# Patient Record
Sex: Female | Born: 1960
Health system: Southern US, Community
[De-identification: ages and names within clinical notes are randomized; demographics above are authoritative.]

## PROBLEM LIST (undated history)

## (undated) DIAGNOSIS — Z972 Presence of dental prosthetic device (complete) (partial): Secondary | ICD-10-CM

## (undated) DIAGNOSIS — F99 Mental disorder, not otherwise specified: Secondary | ICD-10-CM

## (undated) DIAGNOSIS — R87629 Unspecified abnormal cytological findings in specimens from vagina: Secondary | ICD-10-CM

## (undated) DIAGNOSIS — D649 Anemia, unspecified: Secondary | ICD-10-CM

## (undated) DIAGNOSIS — Z973 Presence of spectacles and contact lenses: Secondary | ICD-10-CM

## (undated) DIAGNOSIS — F329 Major depressive disorder, single episode, unspecified: Secondary | ICD-10-CM

## (undated) DIAGNOSIS — N189 Chronic kidney disease, unspecified: Secondary | ICD-10-CM

## (undated) DIAGNOSIS — F32A Depression, unspecified: Secondary | ICD-10-CM

## (undated) DIAGNOSIS — J42 Unspecified chronic bronchitis: Secondary | ICD-10-CM

## (undated) DIAGNOSIS — R0602 Shortness of breath: Secondary | ICD-10-CM

## (undated) DIAGNOSIS — F419 Anxiety disorder, unspecified: Secondary | ICD-10-CM

## (undated) DIAGNOSIS — M810 Age-related osteoporosis without current pathological fracture: Secondary | ICD-10-CM

## (undated) DIAGNOSIS — J189 Pneumonia, unspecified organism: Secondary | ICD-10-CM

## (undated) DIAGNOSIS — M1990 Unspecified osteoarthritis, unspecified site: Secondary | ICD-10-CM

## (undated) HISTORY — PX: WISDOM TOOTH EXTRACTION: SHX21

## (undated) HISTORY — PX: MOUTH SURGERY: SHX715

## (undated) HISTORY — DX: Chronic kidney disease, unspecified: N18.9

## (undated) HISTORY — DX: Unspecified abnormal cytological findings in specimens from vagina: R87.629

## (undated) HISTORY — DX: Anemia, unspecified: D64.9

## (undated) HISTORY — DX: Unspecified osteoarthritis, unspecified site: M19.90

---
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
## 1996-02-01 HISTORY — PX: KNEE ARTHROSCOPY: SUR90

## 1997-07-01 ENCOUNTER — Emergency Department (HOSPITAL_COMMUNITY): Admission: EM | Admit: 1997-07-01 | Discharge: 1997-07-01 | Payer: Self-pay | Admitting: Emergency Medicine

## 1998-01-03 ENCOUNTER — Encounter: Payer: Self-pay | Admitting: Emergency Medicine

## 1998-01-03 ENCOUNTER — Emergency Department (HOSPITAL_COMMUNITY): Admission: EM | Admit: 1998-01-03 | Discharge: 1998-01-03 | Payer: Self-pay | Admitting: Emergency Medicine

## 1998-12-11 ENCOUNTER — Other Ambulatory Visit: Admission: RE | Admit: 1998-12-11 | Discharge: 1998-12-11 | Payer: Self-pay | Admitting: Family Medicine

## 1998-12-31 ENCOUNTER — Encounter: Admission: RE | Admit: 1998-12-31 | Discharge: 1998-12-31 | Payer: Self-pay | Admitting: Family Medicine

## 1998-12-31 ENCOUNTER — Encounter: Payer: Self-pay | Admitting: Family Medicine

## 2002-01-23 ENCOUNTER — Encounter: Payer: Self-pay | Admitting: Emergency Medicine

## 2002-01-23 ENCOUNTER — Emergency Department (HOSPITAL_COMMUNITY): Admission: EM | Admit: 2002-01-23 | Discharge: 2002-01-23 | Payer: Self-pay | Admitting: Emergency Medicine

## 2002-08-13 ENCOUNTER — Encounter: Admission: RE | Admit: 2002-08-13 | Discharge: 2002-08-13 | Payer: Self-pay | Admitting: Nephrology

## 2002-08-13 ENCOUNTER — Encounter: Payer: Self-pay | Admitting: Nephrology

## 2003-05-02 ENCOUNTER — Inpatient Hospital Stay (HOSPITAL_COMMUNITY): Admission: AD | Admit: 2003-05-02 | Discharge: 2003-05-03 | Payer: Self-pay | Admitting: *Deleted

## 2004-02-20 ENCOUNTER — Inpatient Hospital Stay (HOSPITAL_COMMUNITY): Admission: EM | Admit: 2004-02-20 | Discharge: 2004-02-24 | Payer: Self-pay | Admitting: Emergency Medicine

## 2004-03-30 ENCOUNTER — Other Ambulatory Visit: Admission: RE | Admit: 2004-03-30 | Discharge: 2004-03-30 | Payer: Self-pay | Admitting: Obstetrics and Gynecology

## 2004-05-13 ENCOUNTER — Emergency Department (HOSPITAL_COMMUNITY): Admission: EM | Admit: 2004-05-13 | Discharge: 2004-05-13 | Payer: Self-pay | Admitting: Emergency Medicine

## 2005-01-04 ENCOUNTER — Ambulatory Visit: Payer: Self-pay | Admitting: Internal Medicine

## 2005-02-15 ENCOUNTER — Ambulatory Visit: Payer: Self-pay | Admitting: Internal Medicine

## 2005-02-23 ENCOUNTER — Ambulatory Visit (HOSPITAL_COMMUNITY): Admission: RE | Admit: 2005-02-23 | Discharge: 2005-02-23 | Payer: Self-pay | Admitting: Internal Medicine

## 2005-02-23 ENCOUNTER — Ambulatory Visit: Payer: Self-pay | Admitting: Internal Medicine

## 2005-03-09 ENCOUNTER — Ambulatory Visit (HOSPITAL_COMMUNITY): Admission: RE | Admit: 2005-03-09 | Discharge: 2005-03-09 | Payer: Self-pay | Admitting: Internal Medicine

## 2005-03-09 ENCOUNTER — Ambulatory Visit: Payer: Self-pay | Admitting: Internal Medicine

## 2005-03-24 ENCOUNTER — Ambulatory Visit: Payer: Self-pay | Admitting: Internal Medicine

## 2005-06-07 ENCOUNTER — Encounter (INDEPENDENT_AMBULATORY_CARE_PROVIDER_SITE_OTHER): Payer: Self-pay | Admitting: Internal Medicine

## 2005-06-07 ENCOUNTER — Ambulatory Visit (HOSPITAL_COMMUNITY): Admission: RE | Admit: 2005-06-07 | Discharge: 2005-06-07 | Payer: Self-pay | Admitting: Obstetrics and Gynecology

## 2005-06-16 ENCOUNTER — Ambulatory Visit: Payer: Self-pay | Admitting: Internal Medicine

## 2005-07-05 ENCOUNTER — Encounter: Admission: RE | Admit: 2005-07-05 | Discharge: 2005-07-28 | Payer: Self-pay | Admitting: Internal Medicine

## 2005-07-14 ENCOUNTER — Ambulatory Visit: Payer: Self-pay | Admitting: Internal Medicine

## 2005-11-29 DIAGNOSIS — F411 Generalized anxiety disorder: Secondary | ICD-10-CM

## 2005-11-29 DIAGNOSIS — M545 Low back pain, unspecified: Secondary | ICD-10-CM | POA: Insufficient documentation

## 2005-11-29 DIAGNOSIS — F17200 Nicotine dependence, unspecified, uncomplicated: Secondary | ICD-10-CM

## 2005-11-29 DIAGNOSIS — M1990 Unspecified osteoarthritis, unspecified site: Secondary | ICD-10-CM | POA: Insufficient documentation

## 2005-11-29 DIAGNOSIS — F1021 Alcohol dependence, in remission: Secondary | ICD-10-CM

## 2005-11-29 DIAGNOSIS — M25539 Pain in unspecified wrist: Secondary | ICD-10-CM

## 2005-11-29 DIAGNOSIS — Z9889 Other specified postprocedural states: Secondary | ICD-10-CM

## 2005-11-29 DIAGNOSIS — M25569 Pain in unspecified knee: Secondary | ICD-10-CM

## 2006-01-11 DIAGNOSIS — F1910 Other psychoactive substance abuse, uncomplicated: Secondary | ICD-10-CM

## 2006-01-11 DIAGNOSIS — Z8619 Personal history of other infectious and parasitic diseases: Secondary | ICD-10-CM

## 2006-01-11 DIAGNOSIS — G894 Chronic pain syndrome: Secondary | ICD-10-CM | POA: Insufficient documentation

## 2006-11-17 ENCOUNTER — Ambulatory Visit (HOSPITAL_COMMUNITY): Admission: RE | Admit: 2006-11-17 | Discharge: 2006-11-17 | Payer: Self-pay | Admitting: Obstetrics and Gynecology

## 2007-12-14 ENCOUNTER — Encounter: Admission: RE | Admit: 2007-12-14 | Discharge: 2007-12-14 | Payer: Self-pay | Admitting: Obstetrics and Gynecology

## 2008-06-13 ENCOUNTER — Encounter: Admission: RE | Admit: 2008-06-13 | Discharge: 2008-06-13 | Payer: Self-pay | Admitting: Obstetrics and Gynecology

## 2008-07-17 ENCOUNTER — Emergency Department (HOSPITAL_COMMUNITY): Admission: EM | Admit: 2008-07-17 | Discharge: 2008-07-17 | Payer: Self-pay | Admitting: Emergency Medicine

## 2009-02-06 ENCOUNTER — Encounter: Admission: RE | Admit: 2009-02-06 | Discharge: 2009-02-06 | Payer: Self-pay | Admitting: Family Medicine

## 2009-07-06 ENCOUNTER — Emergency Department (HOSPITAL_COMMUNITY): Admission: EM | Admit: 2009-07-06 | Discharge: 2009-07-07 | Payer: Self-pay | Admitting: Emergency Medicine

## 2009-11-05 ENCOUNTER — Encounter: Admission: RE | Admit: 2009-11-05 | Discharge: 2009-11-05 | Payer: Self-pay | Admitting: Obstetrics and Gynecology

## 2010-02-21 ENCOUNTER — Encounter: Payer: Self-pay | Admitting: Obstetrics and Gynecology

## 2010-06-18 NOTE — Discharge Summary (Signed)
NAMERaye [name]            ACCOUNT NO.:  [id number]   MEDICAL RECORD NO.:  [id number]          PATIENT TYPE:  INP   LOCATION:  0448                         FACILITY:  [facility]   PHYSICIAN:  Jarome Matin, M.D.DATE OF BIRTH:  1960-11-29   DATE OF ADMISSION:  02/20/2004  DATE OF DISCHARGE:  02/24/2004                                 DISCHARGE SUMMARY   HISTORY OF PRESENT ILLNESS:  Ms. Kendra [name] is a 50 year old white female who  came to the emergency room on the morning of February 20, 2004, complaining  of severe pain in her right arm.  She apparently was at a party several days  prior and had been shooting up her arms with cocaine.  She then developed  cellulitis in the arm and at the site of the cocaine injection.  The pain  continued.   PHYSICAL EXAMINATION:  VITAL SIGNS:  Temperature 99.4, pulse of 74,  respirations 20, blood pressure 111/59.  HEENT:  Negative.  CHEST:  Clear to auscultation and percussion.  HEART:  Regular sinus rhythm.  ABDOMEN:  Soft, no mass, no organomegaly.  She had active bibasilar sounds.  She also had some pain in her bladder area.  EXTREMITIES:  Right arm was swollen, painful around the upper supinator  portion of the arm, elbow; the area over the elbow and the muscle.  The area  was red, swollen, it had cellulitis over the lower arm near the elbow.  The  patient has a long history of back pain since an accident in 1999, and hit a  disk in her lumbosacral spine area.   HOSPITAL COURSE:  The patient was restarted on Clinoril 200 mg b.i.d., and  continued on Percocet 5 mg.  We changed her to Ancef 1 g IV q.8h., warm  soaks on her arm, put her on a regular diet.  Cultures of the wound grew out  some microaerophilic Streptococcus.  Blood cultures were essentially  negative.  Urinalysis was cloudy.  Showed protein, nitrites positive, with  many epithelial's and many bacteria.  Urine drug screen was positive for  marijuana.  White count was 13.8.   We continued her on the Ancef IV and gave  her some Darvocet between Percocet, two tablets q.4h.  The patient did talk  with a drug counselor, who gave her the option to come in for counseling.  Gradually, the swelling in the arm improved.  The patient was seen and  assumed to have an abscess that was draining.  We asked general surgeon to  take a look at her.  It was drained and dressed, and she was going to  continue to be managed as an outpatient and stay on the antibiotics.  Surgery came back to help to clean the wound.  The patient was discharged to  continue to clean the wound and she could go to work next week, and he would  see her in his office as an outpatient.  The patient felt better.  Wound  seemed to be healing and we decided to discharge the patient.  We discharged  her with Keflex 500 mg b.i.d.  for 10 days, Clinoril 200 mg b.i.d., and clean  the wound in the shower with saline and Betadine.  The patient to return to  work the next Monday, March 01, 2004, and she is to come to my office and  Dr. Verna Czech office, the surgeon, in about one to two weeks.  Temperature  was normal, pulse was normal, respirations were normal, blood pressure  160/73, O2 saturations were 98% on room air.  The patient was discharged  home.     CEF/MEDQ  D:  06/16/2004  T:  06/16/2004  Job:  [id number]

## 2010-06-18 NOTE — Consult Note (Signed)
NAMEFloy Sabina NO.:  [id number]   MEDICAL RECORD NO.:  [id number]          PATIENT TYPE:  INP   LOCATION:  0448                         FACILITY:  [facility]   PHYSICIAN:  Anselm Pancoast. Weatherly, M.D.DATE OF BIRTH:  1960/02/21   DATE OF CONSULTATION:  02/23/2004  DATE OF DISCHARGE:                                   CONSULTATION   REASON FOR CONSULTATION:  Antecubital abscess from intravenous drug  injections.   HISTORY:  Kendra [name] is a 50 year old female who was admitted through  the emergency room by Dr. Jeri Cos on February 20, 2004 with a  diagnosis of right arm cellulitis.  At the time I do not think she was  admitting to IV drug use.  The area has become more swollen and then drained  spontaneously over the past 24 hours.  The patient states that she has used  drugs previously and on examination it appears that this has had a previous  transverse incision in the area that has drained.  I do not appreciate any  other areas of undrained fluctuance or tenderness at this time and I think  clinically this will resolve with IV antibiotics and warm soaks.  The  culture is so far showing a gram-positive cocci but no sensitivities, and  the question whether MRSA is present is certainly under consideration.  She  has been afebrile for the last 48 hours and is presently on Rocephin.  I  would wait until we have got sensitivities and then manage her as outpatient  with warm soaks.  The patient is concerned about the incision - does it need  to be closed.  This needs to be warm soaks and left open because it will  contract very nicely with a transverse-type previous incision and avoidance  of other IV stick attempts in this area is definitely suggested.  I will  follow the next day or two but no further I&D is needed at this time.                                               ______________________________  Anselm Pancoast. Zachery Dakins, M.D.    WJW/MEDQ  D:   02/23/2004  T:  02/23/2004  Job:  [id number]

## 2010-11-26 ENCOUNTER — Other Ambulatory Visit (HOSPITAL_COMMUNITY): Payer: Self-pay | Admitting: Obstetrics and Gynecology

## 2010-11-26 DIAGNOSIS — Z1231 Encounter for screening mammogram for malignant neoplasm of breast: Secondary | ICD-10-CM

## 2010-12-14 ENCOUNTER — Ambulatory Visit
Admission: RE | Admit: 2010-12-14 | Discharge: 2010-12-14 | Disposition: A | Discharge status: Home / Self Care | Payer: PRIVATE HEALTH INSURANCE | Source: Ambulatory Visit | Attending: Obstetrics and Gynecology | Admitting: Obstetrics and Gynecology

## 2010-12-14 DIAGNOSIS — Z1231 Encounter for screening mammogram for malignant neoplasm of breast: Secondary | ICD-10-CM

## 2011-08-11 ENCOUNTER — Other Ambulatory Visit: Payer: Self-pay | Admitting: Obstetrics and Gynecology

## 2011-08-31 ENCOUNTER — Encounter (HOSPITAL_COMMUNITY): Payer: Self-pay | Admitting: Pharmacist

## 2011-09-06 ENCOUNTER — Encounter (HOSPITAL_COMMUNITY)
Admission: RE | Admit: 2011-09-06 | Discharge: 2011-09-06 | Disposition: A | Discharge status: Home / Self Care | Payer: PRIVATE HEALTH INSURANCE | Source: Ambulatory Visit | Attending: Obstetrics and Gynecology | Admitting: Obstetrics and Gynecology

## 2011-09-06 ENCOUNTER — Encounter (HOSPITAL_COMMUNITY): Payer: Self-pay

## 2011-09-06 HISTORY — DX: Unspecified chronic bronchitis: J42

## 2011-09-06 HISTORY — DX: Major depressive disorder, single episode, unspecified: F32.9

## 2011-09-06 HISTORY — DX: Anxiety disorder, unspecified: F41.9

## 2011-09-06 HISTORY — DX: Depression, unspecified: F32.A

## 2011-09-06 HISTORY — DX: Shortness of breath: R06.02

## 2011-09-06 HISTORY — DX: Mental disorder, not otherwise specified: F99

## 2011-09-06 LAB — CBC
HCT: 39.4 % (ref 36.0–46.0)
Hemoglobin: 13.1 g/dL (ref 12.0–15.0)
MCH: 30.4 pg (ref 26.0–34.0)
MCHC: 33.2 g/dL (ref 30.0–36.0)
RDW: 14.4 % (ref 11.5–15.5)

## 2011-09-06 LAB — COMPREHENSIVE METABOLIC PANEL
Creatinine, Ser: 0.55 mg/dL (ref 0.50–1.10)
GFR calc Af Amer: >90 mL/min (ref >90)
GFR calc non Af Amer: >90 mL/min (ref >90)
Sodium: 137 mEq/L (ref 135–145)
Total Bilirubin: 0.2 mg/dL — ABNORMAL LOW (ref 0.3–1.2)

## 2011-09-06 LAB — URINALYSIS, ROUTINE W REFLEX MICROSCOPIC
Ketones, ur: NEGATIVE mg/dL
Leukocytes, UA: NEGATIVE
Nitrite: NEGATIVE
Specific Gravity, Urine: <1.005 — ABNORMAL LOW (ref 1.005–1.030)
Urobilinogen, UA: 0.2 mg/dL (ref 0.0–1.0)
pH: 6 (ref 5.0–8.0)

## 2011-09-06 LAB — URINE MICROSCOPIC-ADD ON

## 2011-09-06 NOTE — Patient Instructions (Addendum)
Your procedure is scheduled on:09/13/11  Enter through the Main Entrance at :6am Pick up desk phone and dial [phone] and inform us of your arrival.  Please call [phone] if you have any problems the morning of surgery.  Remember: Do not eat after midnight:Monday Do not drink after:Midnight Monday  Take these meds the morning of surgery with a sip of water:Citalopram, Crestor  DO NOT wear jewelry, eye make-up, lipstick,body lotion, or dark fingernail polish. Do not shave for 48 hours prior to surgery.  If you are to be admitted after surgery, leave suitcase in car until your room has been assigned. Patients discharged on the day of surgery will not be allowed to drive home.   Remember to use your Hibiclens as instructed.

## 2011-09-13 ENCOUNTER — Encounter (HOSPITAL_COMMUNITY): Payer: Self-pay | Admitting: *Deleted

## 2011-09-13 ENCOUNTER — Inpatient Hospital Stay (HOSPITAL_COMMUNITY)
Admission: RE | Admit: 2011-09-13 | Discharge: 2011-09-15 | DRG: 743 | Disposition: A | Discharge status: Home / Self Care | Payer: PRIVATE HEALTH INSURANCE | Source: Ambulatory Visit | Attending: Obstetrics and Gynecology | Admitting: Obstetrics and Gynecology

## 2011-09-13 ENCOUNTER — Inpatient Hospital Stay (HOSPITAL_COMMUNITY): Payer: PRIVATE HEALTH INSURANCE | Admitting: Anesthesiology

## 2011-09-13 ENCOUNTER — Encounter (HOSPITAL_COMMUNITY)
Admission: RE | Disposition: A | Discharge status: Home / Self Care | Payer: Self-pay | Source: Ambulatory Visit | Attending: Obstetrics and Gynecology

## 2011-09-13 ENCOUNTER — Encounter (HOSPITAL_COMMUNITY): Payer: Self-pay | Admitting: Anesthesiology

## 2011-09-13 DIAGNOSIS — N946 Dysmenorrhea, unspecified: Secondary | ICD-10-CM | POA: Diagnosis present

## 2011-09-13 DIAGNOSIS — N920 Excessive and frequent menstruation with regular cycle: Secondary | ICD-10-CM

## 2011-09-13 DIAGNOSIS — D252 Subserosal leiomyoma of uterus: Secondary | ICD-10-CM | POA: Diagnosis present

## 2011-09-13 DIAGNOSIS — N949 Unspecified condition associated with female genital organs and menstrual cycle: Principal | ICD-10-CM | POA: Diagnosis present

## 2011-09-13 DIAGNOSIS — D251 Intramural leiomyoma of uterus: Secondary | ICD-10-CM | POA: Diagnosis present

## 2011-09-13 DIAGNOSIS — N831 Corpus luteum cyst of ovary, unspecified side: Secondary | ICD-10-CM | POA: Diagnosis present

## 2011-09-13 DIAGNOSIS — D219 Benign neoplasm of connective and other soft tissue, unspecified: Secondary | ICD-10-CM

## 2011-09-13 DIAGNOSIS — N938 Other specified abnormal uterine and vaginal bleeding: Principal | ICD-10-CM | POA: Diagnosis present

## 2011-09-13 DIAGNOSIS — F411 Generalized anxiety disorder: Secondary | ICD-10-CM

## 2011-09-13 DIAGNOSIS — F17200 Nicotine dependence, unspecified, uncomplicated: Secondary | ICD-10-CM

## 2011-09-13 HISTORY — PX: ABDOMINAL HYSTERECTOMY: SHX81

## 2011-09-13 LAB — CBC
Hemoglobin: 12.5 g/dL (ref 12.0–15.0)
MCH: 30.2 pg (ref 26.0–34.0)
MCV: 92.8 fL (ref 78.0–100.0)
RBC: 4.14 MIL/uL (ref 3.87–5.11)
WBC: 16.1 10*3/uL — ABNORMAL HIGH (ref 4.0–10.5)

## 2011-09-13 LAB — CREATININE, SERUM
Creatinine, Ser: 0.65 mg/dL (ref 0.50–1.10)
GFR calc Af Amer: >90 mL/min (ref >90)

## 2011-09-13 SURGERY — HYSTERECTOMY, ABDOMINAL
Anesthesia: General | Site: Abdomen | Wound class: Clean Contaminated

## 2011-09-13 MED ORDER — HYDROMORPHONE 0.3 MG/ML IV SOLN
INTRAVENOUS | Status: DC
  Administered 2011-09-13: 2.6 mg via INTRAVENOUS
  Administered 2011-09-13: 2.59 mg via INTRAVENOUS
  Administered 2011-09-13: 1.3 mg via INTRAVENOUS
  Administered 2011-09-13: 11:00:00 via INTRAVENOUS
  Filled 2011-09-13: qty 25

## 2011-09-13 MED ORDER — FENTANYL CITRATE 0.05 MG/ML IJ SOLN
INTRAMUSCULAR | Status: AC
  Administered 2011-09-13: 50 ug via INTRAVENOUS
  Filled 2011-09-13: qty 2

## 2011-09-13 MED ORDER — ONDANSETRON HCL 4 MG/2ML IJ SOLN: 4.0000 mg | Freq: Four times a day (QID) | INTRAMUSCULAR | Status: DC | PRN

## 2011-09-13 MED ORDER — LACTATED RINGERS IV SOLN
INTRAVENOUS | Status: DC
  Administered 2011-09-13 – 2011-09-14 (×3): via INTRAVENOUS

## 2011-09-13 MED ORDER — ALPRAZOLAM 0.5 MG PO TABS
1.0000 mg | ORAL_TABLET | Freq: Two times a day (BID) | ORAL | Status: DC | PRN
  Administered 2011-09-14 (×2): 1 mg via ORAL
  Filled 2011-09-13 (×2): qty 1
  Filled 2011-09-13: qty 2

## 2011-09-13 MED ORDER — ATORVASTATIN CALCIUM 20 MG PO TABS
20.0000 mg | ORAL_TABLET | Freq: Every day | ORAL | Status: DC
  Administered 2011-09-14: 20 mg via ORAL
  Filled 2011-09-13 (×2): qty 1

## 2011-09-13 MED ORDER — HEPARIN SODIUM (PORCINE) 5000 UNIT/ML IJ SOLN
INTRAMUSCULAR | Status: AC
  Administered 2011-09-13: 5000 [IU] via SUBCUTANEOUS
  Filled 2011-09-13: qty 1

## 2011-09-13 MED ORDER — OXYCODONE-ACETAMINOPHEN 5-325 MG PO TABS: 1.0000 | ORAL_TABLET | ORAL | Status: DC | PRN

## 2011-09-13 MED ORDER — CEFAZOLIN SODIUM 1-5 GM-% IV SOLN
1.0000 g | Freq: Three times a day (TID) | INTRAVENOUS | Status: AC
  Administered 2011-09-13 (×2): 1 g via INTRAVENOUS
  Filled 2011-09-13 (×2): qty 50

## 2011-09-13 MED ORDER — CITALOPRAM HYDROBROMIDE 20 MG PO TABS
20.0000 mg | ORAL_TABLET | Freq: Every day | ORAL | Status: DC
  Administered 2011-09-14: 20 mg via ORAL
  Filled 2011-09-13 (×2): qty 1

## 2011-09-13 MED ORDER — HEPARIN SODIUM (PORCINE) 5000 UNIT/ML IJ SOLN
5000.0000 [IU] | Freq: Three times a day (TID) | INTRAMUSCULAR | Status: DC
  Administered 2011-09-13 – 2011-09-15 (×6): 5000 [IU] via SUBCUTANEOUS
  Filled 2011-09-13 (×7): qty 1

## 2011-09-13 MED ORDER — MIDAZOLAM HCL 5 MG/5ML IJ SOLN
INTRAMUSCULAR | Status: DC | PRN
  Administered 2011-09-13: 2 mg via INTRAVENOUS

## 2011-09-13 MED ORDER — CEFAZOLIN SODIUM-DEXTROSE 2-3 GM-% IV SOLR
INTRAVENOUS | Status: AC
  Administered 2011-09-13: 2 g via INTRAVENOUS
  Filled 2011-09-13: qty 50

## 2011-09-13 MED ORDER — LACTATED RINGERS IV SOLN
INTRAVENOUS | Status: DC
Start: 2011-09-13 — End: 2011-09-13
  Administered 2011-09-13 (×3): via INTRAVENOUS

## 2011-09-13 MED ORDER — ALBUTEROL SULFATE HFA 108 (90 BASE) MCG/ACT IN AERS
2.0000 | INHALATION_SPRAY | Freq: Four times a day (QID) | RESPIRATORY_TRACT | Status: DC | PRN
  Filled 2011-09-13: qty 6.7

## 2011-09-13 MED ORDER — FENTANYL CITRATE 0.05 MG/ML IJ SOLN
INTRAMUSCULAR | Status: DC | PRN
  Administered 2011-09-13: 50 ug via INTRAVENOUS
  Administered 2011-09-13 (×2): 100 ug via INTRAVENOUS

## 2011-09-13 MED ORDER — FENTANYL CITRATE 0.05 MG/ML IJ SOLN
INTRAMUSCULAR | Status: AC
  Filled 2011-09-13: qty 5

## 2011-09-13 MED ORDER — PROPOFOL 10 MG/ML IV EMUL
INTRAVENOUS | Status: DC | PRN
  Administered 2011-09-13: 60 mg via INTRAVENOUS
  Administered 2011-09-13: 140 mg via INTRAVENOUS

## 2011-09-13 MED ORDER — LIDOCAINE HCL (CARDIAC) 20 MG/ML IV SOLN
INTRAVENOUS | Status: DC | PRN
  Administered 2011-09-13: 40 mg via INTRAVENOUS

## 2011-09-13 MED ORDER — ONDANSETRON HCL 4 MG PO TABS: 4.0000 mg | ORAL_TABLET | Freq: Four times a day (QID) | ORAL | Status: DC | PRN

## 2011-09-13 MED ORDER — HYDROMORPHONE HCL PF 1 MG/ML IJ SOLN
INTRAMUSCULAR | Status: AC
  Administered 2011-09-13: 0.5 mg via INTRAVENOUS
  Filled 2011-09-13: qty 1

## 2011-09-13 MED ORDER — HYDROMORPHONE HCL PF 1 MG/ML IJ SOLN
0.5000 mg | INTRAMUSCULAR | Status: DC | PRN
  Administered 2011-09-13 (×3): 0.5 mg via INTRAVENOUS

## 2011-09-13 MED ORDER — FENTANYL CITRATE 0.05 MG/ML IJ SOLN
25.0000 ug | INTRAMUSCULAR | Status: DC | PRN
  Administered 2011-09-13 (×2): 50 ug via INTRAVENOUS

## 2011-09-13 MED ORDER — LIDOCAINE HCL (CARDIAC) 20 MG/ML IV SOLN
INTRAVENOUS | Status: AC
  Filled 2011-09-13: qty 5

## 2011-09-13 MED ORDER — MIDAZOLAM HCL 2 MG/2ML IJ SOLN
INTRAMUSCULAR | Status: AC
  Filled 2011-09-13: qty 2

## 2011-09-13 MED ORDER — NALOXONE HCL 0.4 MG/ML IJ SOLN: 0.4000 mg | INTRAMUSCULAR | Status: DC | PRN

## 2011-09-13 MED ORDER — CEFAZOLIN SODIUM-DEXTROSE 2-3 GM-% IV SOLR: 2.0000 g | INTRAVENOUS | Status: DC

## 2011-09-13 MED ORDER — NEOSTIGMINE METHYLSULFATE 1 MG/ML IJ SOLN
INTRAMUSCULAR | Status: DC | PRN
  Administered 2011-09-13: 4 mg via INTRAVENOUS

## 2011-09-13 MED ORDER — ALBUTEROL SULFATE (5 MG/ML) 0.5% IN NEBU
2.5000 mg | INHALATION_SOLUTION | Freq: Once | RESPIRATORY_TRACT | Status: AC
  Administered 2011-09-13: 2.5 mg via RESPIRATORY_TRACT

## 2011-09-13 MED ORDER — EPHEDRINE SULFATE 50 MG/ML IJ SOLN
INTRAMUSCULAR | Status: DC | PRN
  Administered 2011-09-13: 5 mg via INTRAVENOUS

## 2011-09-13 MED ORDER — DEXAMETHASONE SODIUM PHOSPHATE 10 MG/ML IJ SOLN
INTRAMUSCULAR | Status: AC
  Filled 2011-09-13: qty 1

## 2011-09-13 MED ORDER — ROCURONIUM BROMIDE 100 MG/10ML IV SOLN
INTRAVENOUS | Status: DC | PRN
  Administered 2011-09-13: 10 mg via INTRAVENOUS
  Administered 2011-09-13: 40 mg via INTRAVENOUS

## 2011-09-13 MED ORDER — 0.9 % SODIUM CHLORIDE (POUR BTL) OPTIME
TOPICAL | Status: DC | PRN
  Administered 2011-09-13: 500 mL

## 2011-09-13 MED ORDER — DIPHENHYDRAMINE HCL 12.5 MG/5ML PO ELIX: 12.5000 mg | ORAL_SOLUTION | Freq: Four times a day (QID) | ORAL | Status: DC | PRN

## 2011-09-13 MED ORDER — KETOROLAC TROMETHAMINE 30 MG/ML IJ SOLN: 15.0000 mg | Freq: Once | INTRAMUSCULAR | Status: DC | PRN

## 2011-09-13 MED ORDER — HYDROMORPHONE HCL PF 1 MG/ML IJ SOLN
INTRAMUSCULAR | Status: AC
  Filled 2011-09-13: qty 1

## 2011-09-13 MED ORDER — HYDROMORPHONE HCL PF 1 MG/ML IJ SOLN
INTRAMUSCULAR | Status: DC | PRN
  Administered 2011-09-13: 1 mg via INTRAVENOUS

## 2011-09-13 MED ORDER — ONDANSETRON HCL 4 MG/2ML IJ SOLN
4.0000 mg | Freq: Four times a day (QID) | INTRAMUSCULAR | Status: DC | PRN
  Administered 2011-09-14: 4 mg via INTRAVENOUS
  Filled 2011-09-13: qty 2

## 2011-09-13 MED ORDER — GLYCOPYRROLATE 0.2 MG/ML IJ SOLN
INTRAMUSCULAR | Status: AC
  Filled 2011-09-13: qty 2

## 2011-09-13 MED ORDER — MIDAZOLAM HCL 2 MG/2ML IJ SOLN: 0.5000 mg | Freq: Once | INTRAMUSCULAR | Status: DC | PRN

## 2011-09-13 MED ORDER — ONDANSETRON HCL 4 MG/2ML IJ SOLN
INTRAMUSCULAR | Status: AC
  Filled 2011-09-13: qty 2

## 2011-09-13 MED ORDER — NEOSTIGMINE METHYLSULFATE 1 MG/ML IJ SOLN
INTRAMUSCULAR | Status: AC
  Filled 2011-09-13: qty 10

## 2011-09-13 MED ORDER — OXYCODONE-ACETAMINOPHEN 5-325 MG PO TABS
1.0000 | ORAL_TABLET | ORAL | Status: DC | PRN
  Administered 2011-09-13 – 2011-09-14 (×3): 2 via ORAL
  Filled 2011-09-13 (×3): qty 2

## 2011-09-13 MED ORDER — HEPARIN SODIUM (PORCINE) 5000 UNIT/ML IJ SOLN
5000.0000 [IU] | INTRAMUSCULAR | Status: AC
  Administered 2011-09-13: 5000 [IU] via SUBCUTANEOUS

## 2011-09-13 MED ORDER — DIPHENHYDRAMINE HCL 50 MG/ML IJ SOLN: 12.5000 mg | Freq: Four times a day (QID) | INTRAMUSCULAR | Status: DC | PRN

## 2011-09-13 MED ORDER — EPHEDRINE 5 MG/ML INJ
INTRAVENOUS | Status: AC
  Filled 2011-09-13: qty 10

## 2011-09-13 MED ORDER — PROPOFOL 10 MG/ML IV EMUL
INTRAVENOUS | Status: AC
  Filled 2011-09-13: qty 20

## 2011-09-13 MED ORDER — GLYCOPYRROLATE 0.2 MG/ML IJ SOLN
INTRAMUSCULAR | Status: AC
  Filled 2011-09-13: qty 1

## 2011-09-13 MED ORDER — ALBUTEROL SULFATE (5 MG/ML) 0.5% IN NEBU
INHALATION_SOLUTION | RESPIRATORY_TRACT | Status: AC
  Administered 2011-09-13: 2.5 mg via RESPIRATORY_TRACT
  Filled 2011-09-13: qty 0.5

## 2011-09-13 MED ORDER — ROCURONIUM BROMIDE 50 MG/5ML IV SOLN
INTRAVENOUS | Status: AC
  Filled 2011-09-13: qty 1

## 2011-09-13 MED ORDER — SODIUM CHLORIDE 0.9 % IJ SOLN: 9.0000 mL | INTRAMUSCULAR | Status: DC | PRN

## 2011-09-13 MED ORDER — TEMAZEPAM 15 MG PO CAPS
30.0000 mg | ORAL_CAPSULE | Freq: Every evening | ORAL | Status: DC | PRN
  Administered 2011-09-14: 30 mg via ORAL
  Filled 2011-09-13: qty 2

## 2011-09-13 MED ORDER — MEPERIDINE HCL 25 MG/ML IJ SOLN: 6.2500 mg | INTRAMUSCULAR | Status: DC | PRN

## 2011-09-13 MED ORDER — GLYCOPYRROLATE 0.2 MG/ML IJ SOLN
INTRAMUSCULAR | Status: DC | PRN
  Administered 2011-09-13: 0.2 mg via INTRAVENOUS
  Administered 2011-09-13: .8 mg via INTRAVENOUS

## 2011-09-13 MED ORDER — PROMETHAZINE HCL 25 MG/ML IJ SOLN: 6.2500 mg | INTRAMUSCULAR | Status: DC | PRN

## 2011-09-13 SURGICAL SUPPLY — 31 items
CANISTER SUCTION 2500CC (MISCELLANEOUS) ×2 IMPLANT
CLOTH BEACON ORANGE TIMEOUT ST (SAFETY) ×2 IMPLANT
CONT PATH 16OZ SNAP LID 3702 (MISCELLANEOUS) ×2 IMPLANT
DECANTER SPIKE VIAL GLASS SM (MISCELLANEOUS) IMPLANT
DRSG VASELINE 3X18 (GAUZE/BANDAGES/DRESSINGS) ×1 IMPLANT
GAUZE SPONGE 4X4 12PLY STRL LF (GAUZE/BANDAGES/DRESSINGS) ×2 IMPLANT
GLOVE BIO SURGEON STRL SZ7.5 (GLOVE) ×4 IMPLANT
GOWN PREVENTION PLUS LG XLONG (DISPOSABLE) ×4 IMPLANT
GOWN PREVENTION PLUS XLARGE (GOWN DISPOSABLE) ×2 IMPLANT
NS IRRIG 1000ML POUR BTL (IV SOLUTION) ×3 IMPLANT
PACK ABDOMINAL GYN (CUSTOM PROCEDURE TRAY) ×2 IMPLANT
PAD OB MATERNITY 4.3X12.25 (PERSONAL CARE ITEMS) ×2 IMPLANT
PROTECTOR NERVE ULNAR (MISCELLANEOUS) ×2 IMPLANT
RETRACTOR WND ALEXIS 25 LRG (MISCELLANEOUS) IMPLANT
RTRCTR WOUND ALEXIS 25CM LRG (MISCELLANEOUS) ×2
SPONGE GAUZE 4X4 12PLY (GAUZE/BANDAGES/DRESSINGS) ×1 IMPLANT
SPONGE LAP 18X18 X RAY DECT (DISPOSABLE) ×4 IMPLANT
SPONGE LAP 4X18 X RAY DECT (DISPOSABLE) ×2 IMPLANT
STAPLER VISISTAT 35W (STAPLE) ×2 IMPLANT
SUT PDS AB 0 CTX 60 (SUTURE) IMPLANT
SUT VIC AB 0 CT1 18XCR BRD8 (SUTURE) ×3 IMPLANT
SUT VIC AB 0 CT1 27 (SUTURE) ×10
SUT VIC AB 0 CT1 27XBRD ANBCTR (SUTURE) ×3 IMPLANT
SUT VIC AB 0 CT1 8-18 (SUTURE) ×4
SUT VIC AB 3-0 CTX 36 (SUTURE) ×2 IMPLANT
SUT VIC AB 3-0 SH 27 (SUTURE)
SUT VIC AB 3-0 SH 27X BRD (SUTURE) IMPLANT
SUT VICRYL 0 TIES 12 18 (SUTURE) ×2 IMPLANT
TOWEL OR 17X24 6PK STRL BLUE (TOWEL DISPOSABLE) ×4 IMPLANT
TRAY FOLEY CATH 14FR (SET/KITS/TRAYS/PACK) ×2 IMPLANT
WATER STERILE IRR 1000ML POUR (IV SOLUTION) ×1 IMPLANT

## 2011-09-13 NOTE — Progress Notes (Signed)
Post-op check S/p TAH/BSO Comfortable, no c/o Afeb, VSS, clear urine Abd- soft, dressing C/D/I Continue current care

## 2011-09-13 NOTE — Addendum Note (Signed)
Addendum  created 09/13/11 1307 by Graciela Husbands, CRNA   Modules edited:Charges VN

## 2011-09-13 NOTE — H&P (Signed)
Kendra [name], [name] NO.:  [id number]  MEDICAL RECORD NO.:  [id number]  LOCATION:                                 FACILITY:  PHYSICIAN:  Malachi Pro. Ambrose Mantle, M.D. DATE OF BIRTH:  26-May-1960  DATE OF ADMISSION: DATE OF DISCHARGE:                             HISTORY & PHYSICAL   HISTORY OF PRESENT ILLNESS:  This is a 51 year old white female, para 2- 0-3-2, who is admitted to the hospital for abdominal hysterectomy and bilateral salpingo-oophorectomy because of large uterine fibroid, dysmenorrhea, and heavy uterine bleeding.  The patient's last menstrual period was August 29, 2011 lasting until the present, previous period was on July 02, 2011.  Her periods before this last period had never lasted over 8 days.  They are characterized by extremely heavy flow and passage of large clots.  The patient has been known to have fibroids since at least 2008.  In 2011, she requested to have her fibroids removed.  At that time, she claimed use 8-10 tampons a day pass large clots, where she would wear a pad with a tampon at night.  First 2 days of her periods, her pain was 5-6/10.  Uterus had an 8-cm right cornual fibroid. In 2012, she again requested to have surgery for the hysterectomy.  At that time, the fibroid seemed to be 8-10 cm on the right side of her uterus.  At this time, she is admitted for this surgery.  ALLERGIES:  Reveals no latex allergy.  The patient does claimed to be allergic to sulfa and doxycycline.  PAST MEDICAL HISTORY:  She does have a history of generalized anxiety and depression.  She has a history of high cholesterol.  She had spontaneous abortions in '81, '92, and 2005, and she has a history of superficial thrombophlebitis.  SURGICAL HISTORY:  In 1980, wisdom teeth were extracted.  Knee surgery was in 2001.  MEDICATIONS:  She does take citalopram, Crestor, temazepam.  Although she states recently she has not taken the temazepam or  alprazolam.  SOCIAL HISTORY:  The patient does smoke one-half to a pack of cigarettes a day.  She denies illicit substance abuse and denies alcohol use.  OBSTETRIC HISTORY:  She had vaginal deliveries in '86 and '96, had spontaneous abortions in '81, '92, and 2005.  PHYSICAL EXAMINATION:  GENERAL:  The patient is a well-developed, slender white female, in no distress. VITAL SIGNS:  Her blood pressure is 110/70, pulse is 80. HEAD, EYES, NOSE, AND THROAT:  Reveal evidence of nicotine stains on her teeth. NECK:  Supple without thyromegaly. HEART:  Normal size and sounds.  No murmurs. LUNGS:  Clear to auscultation. BREASTS:  Soft without masses. ABDOMEN:  Soft and nontender.  No masses are palpable. PELVIC:  Reveals normal external genitalia.  There is menstrual blood in the vagina.  The cervix is clean.  Uterus is anterior, thought to be about 12-14 week size.  Adnexa are free of masses.  ADMITTING IMPRESSION: 1. Leiomyomata uteri. 2. Dysmenorrhea. 3. Menorrhagia.  PLAN:  The patient is admitted for abdominal hysterectomy.  She has chosen to have her ovaries and tubes removed.  She has been informed of the risks of surgery  including, but not limited to pulmonary embolus, wound disruption, hemorrhage, need for reoperation and/or transfusion, fistula formation, nerve injury, intestinal obstruction.  She also has been advised that no guarantee can be made as to what impact it will have on her sex drive.  The patient is prepared for surgery and agrees to proceed.     Malachi Pro. Ambrose Mantle, M.D.     TFH/MEDQ  D:  09/12/2011  T:  09/12/2011  Job:  [id number]

## 2011-09-13 NOTE — Anesthesia Postprocedure Evaluation (Signed)
  Anesthesia Post-op Note  Patient: Kendra [name]  Procedure(s) Performed: Procedure(s) (LRB): HYSTERECTOMY ABDOMINAL (N/A)  Patient Location: PACU  Anesthesia Type: General  Level of Consciousness: awake  Airway and Oxygen Therapy: Patient Spontanous Breathing  Post-op Pain: mild  Post-op Assessment: Patient's Cardiovascular Status Stable and Respiratory Function Stable  Post-op Vital Signs: stable  Complications: No apparent anesthesia complications

## 2011-09-13 NOTE — Addendum Note (Signed)
Addendum  created 09/13/11 1308 by Graciela Husbands, CRNA   Modules edited:Charges VN

## 2011-09-13 NOTE — Transfer of Care (Signed)
Immediate Anesthesia Transfer of Care Note  Patient: Kendra [name]  Procedure(s) Performed: Procedure(s) (LRB): HYSTERECTOMY ABDOMINAL (N/A)  Patient Location: PACU  Anesthesia Type: General  Level of Consciousness: awake, alert  and oriented  Airway & Oxygen Therapy: Patient Spontanous Breathing and Patient connected to nasal cannula oxygen  Post-op Assessment: Report given to PACU RN and Post -op Vital signs reviewed and stable  Post vital signs: Reviewed and stable  Complications: No apparent anesthesia complications

## 2011-09-13 NOTE — Anesthesia Procedure Notes (Signed)
Procedure Name: Intubation Date/Time: 09/13/2011 7:29 AM Performed by: Graciela Husbands Pre-anesthesia Checklist: Patient identified, Emergency Drugs available, Suction available, Patient being monitored and Timeout performed Patient Re-evaluated:Patient Re-evaluated prior to inductionOxygen Delivery Method: Circle system utilized Preoxygenation: Pre-oxygenation with 100% oxygen Intubation Type: IV induction Ventilation: Mask ventilation without difficulty Laryngoscope Size: Miller and 2 Grade View: Grade I Tube size: 7.0 mm Number of attempts: 1 Placement Confirmation: ETT inserted through vocal cords under direct vision,  positive ETCO2,  CO2 detector and breath sounds checked- equal and bilateral Secured at: 22 cm Tube secured with: Tape Dental Injury: Teeth and Oropharynx as per pre-operative assessment

## 2011-09-13 NOTE — H&P (Signed)
  H&P  50 YO wf PARA 2032 ADMITTED FOR tah-bso BECAUSE OF FIBROIDS, DYSMENORRHEA AND MENORRHAGIA.   LMP 08-29-11 to the present  PMP 07-02-11     This pt has had heavy and painful periods for years and has had a large fibroid for several years She has requested a hysterectomy for years and now has decided to follow through with that desire.   PMH: Allegies: sulfa and doxycycline   ILLNESSES: Anxiety and depression  Operations: Wisdom teeth extracted and knee surgery.  Alcohol and drugs: None Tobacco: possibly 1 pack per day Family Hx: Mother : HBP and other illnesses. Brother 54 good health   PE: BP 109/71 P 65 R 20 T 98.1  HENT: No abnormalities other than stained teeth. The neck is supple without thyromegaly. The breasts are soft without masses The lungs are clear to A The heart has normal sounds and is normal size The abdomen is soft and not tender There are no masses. The external genitalia are normal. There is menstrual blood in the vagina and the cervix is clean. The uterus is anterior and 12 weeks size and enlarged to the right. The adnexa are clear.  Impression: Leiomyomata uteri, menorrhagia, dysmenorrhea   Disp: TAH and BSO The pt has elected to have her ovaries removed. She has been informed of the risks of surgery and id ready to proceed.

## 2011-09-13 NOTE — Anesthesia Postprocedure Evaluation (Signed)
  Anesthesia Post-op Note  Patient: Kendra [name]  Procedure(s) Performed: Procedure(s) (LRB): HYSTERECTOMY ABDOMINAL (N/A)  Patient Location: PACU  Anesthesia Type: General  Level of Consciousness: awake, alert  and oriented  Airway and Oxygen Therapy: Patient Spontanous Breathing  Post-op Pain: mild, moderate  Post-op Assessment: Post-op Vital signs reviewed, Patient's Cardiovascular Status Stable, Respiratory Function Stable, Patent Airway, No signs of Nausea or vomiting and Pain level controlled  Post-op Vital Signs: Reviewed and stable  Complications: No apparent anesthesia complications

## 2011-09-13 NOTE — Addendum Note (Signed)
Addendum  created 09/13/11 1700 by Renford Dills, CRNA   Modules edited:Notes Section

## 2011-09-13 NOTE — Op Note (Signed)
Operative note on Kendra [name]:  Date of the operation: 09/13/2011  Preoperative diagnosis: Leiomyomata uteri, menorrhagia, dysmenorrhea  Postoperative diagnosis: Same  Anesthesia: Gen.  Operation: Total abdominal hysterectomy bilateral salpingo-oophorectomy  Operator: Ambrose Mantle assistant: Senaida Ores  The patient was brought to the operating room and placed under satisfactory general anesthesia by the nurse anesthetist with Dr. Malen Gauze assisting. The patient was placed in a frog-leg position and the abdomen vagina and urethra were prepped with Betadine solution. The bladder was catheterized with a Foley catheter in to straight drainage. Exam revealed the uterus to be 12-14 weeks size and deviated somewhat to the right. There were no adnexal masses. A timeout was done. The patient was placed supine and the abdomen was draped as a sterile field. A transverse incision was made suprapubically and carried in layers through the skin, subcutaneous tissue, and fascia. The fascia was separated from the rectus muscles superiorly and inferiorly and the peritoneum was opened vertically. The upper abdomen was explored. The liver was smooth, the gallbladder was smooth and cystic without stones, and both kidneys felt normal.An Alexis retractor was placed into the abdominal cavity and 4  packs were used to hold the bowel out of the surgical field. There was a large cyst on the left ovary thought to represent a corpus luteum cyst,the right ovary and fallopian tubes were normal, both cul-de-sacs were normal and the uterus was markedly enlarged to about 14 weeks' size. A towel clip was placed on the fibroid and the uterus was elevated into the operative field. The upper pedicles were grasped with Kelly clamps and the round ligaments bilaterally were divided with the Bovie and a bladder flap was developed. The uterine vessels were skeletonized the infundibulopelvic ligaments bilaterally were divided between clamps and  doubly suture ligated, the uterine vessels were clamped cut and suture ligated, the paracervical and parametrial tissues were clamped cut and suture-ligated and the uterosacral ligaments were suture-ligated and held. The vagina was entered from both sides, vaginal angle sutures were placed and then the uterus tubes and ovaries were removed as a single specimen by transecting the upper vagina. The central portion of the vagina was closed with interrupted figure-of-eight sutures of 0 Vicryl. The uterus was opened and I saw no abnormalities of the endometrial cavity other than the large fibroid was deviating the cavity markedly to the left. Both ureters were inspected and found to be free of the operative field and normal in size and contour. The uterosacral ligaments were sutured together in the midline and the vaginal cuff was reperitonealized with interrupted figure-of-eight sutures of 0 Vicryl. Liberal irrigation confirmed hemostasis, the packs and retractor were removed, and the abdominal wall was closed in layers using interrupted sutures of 0 Vicryl to close the rectus muscle and peritoneum in one layer,2 running sutures of 0 Vicryl on the fascia, a running 3-0 Vicryl on the subcutaneous tissue and staples on the skin. The patient seemed to tolerate the procedure well and was returned to recovery in satisfactory condition. Sponge and needle counts were correct and I estimated the blood loss at no more than 200 cc

## 2011-09-13 NOTE — Progress Notes (Signed)
Patient ID: Kendra [name], female   DOB: [birth year], 51 y.o.   MRN: [id number] The pt reports no change in her health since her exam 09-12-11.

## 2011-09-13 NOTE — Anesthesia Preprocedure Evaluation (Addendum)
Anesthesia Evaluation  Patient identified by MRN, date of birth, ID band Patient awake    Reviewed: Allergy & Precautions, H&P , Patient's Chart, lab work & pertinent test results, reviewed documented beta blocker date and time   History of Anesthesia Complications Negative for: history of anesthetic complications  Airway Mallampati: II TM Distance: >3 FB Neck ROM: full    Dental No notable dental hx.    Pulmonary neg pulmonary ROS, shortness of breath, COPD COPD inhaler, Current Smoker,  breath sounds clear to auscultation  Pulmonary exam normal       Cardiovascular Exercise Tolerance: Good negative cardio ROS  Rhythm:regular Rate:Normal     Neuro/Psych PSYCHIATRIC DISORDERS negative neurological ROS  negative psych ROS   GI/Hepatic negative GI ROS, Neg liver ROS,   Endo/Other  negative endocrine ROS  Renal/GU negative Renal ROS     Musculoskeletal   Abdominal   Peds  Hematology negative hematology ROS (+)   Anesthesia Other Findings Anxiety     Mental disorder        Depression     Bronchitis, chronic   inhaler prescribed    Shortness of breath   on exertion, smoker  Past hep C past polysubstance abuse   Reproductive/Obstetrics negative OB ROS                          Anesthesia Physical Anesthesia Plan  ASA: III  Anesthesia Plan: General ETT   Post-op Pain Management:    Induction:   Airway Management Planned:   Additional Equipment:   Intra-op Plan:   Post-operative Plan:   Informed Consent: I have reviewed the patients History and Physical, chart, labs and discussed the procedure including the risks, benefits and alternatives for the proposed anesthesia with the patient or authorized representative who has indicated his/her understanding and acceptance.   Dental Advisory Given  Plan Discussed with: CRNA and Surgeon  Anesthesia Plan Comments:          Anesthesia Quick Evaluation

## 2011-09-14 ENCOUNTER — Encounter (HOSPITAL_COMMUNITY): Payer: Self-pay | Admitting: Obstetrics and Gynecology

## 2011-09-14 LAB — CBC
MCV: 93.7 fL (ref 78.0–100.0)
Platelets: 269 10*3/uL (ref 150–400)
RBC: 3.65 MIL/uL — ABNORMAL LOW (ref 3.87–5.11)
RDW: 14.3 % (ref 11.5–15.5)
WBC: 13.4 10*3/uL — ABNORMAL HIGH (ref 4.0–10.5)

## 2011-09-14 MED ORDER — OXYCODONE-ACETAMINOPHEN 5-325 MG PO TABS
1.0000 | ORAL_TABLET | ORAL | Status: DC | PRN
  Administered 2011-09-14 – 2011-09-15 (×7): 1 via ORAL
  Filled 2011-09-14 (×7): qty 1

## 2011-09-14 MED ORDER — NICOTINE 21 MG/24HR TD PT24
21.0000 mg | MEDICATED_PATCH | Freq: Every day | TRANSDERMAL | Status: DC
  Filled 2011-09-14 (×2): qty 1

## 2011-09-14 NOTE — Progress Notes (Signed)
Ur chart review completed.  

## 2011-09-14 NOTE — Progress Notes (Signed)
Patient ID: Kendra [name], female   DOB: Jul 25, 1960, 51 y.o.   MRN: [id number] #1 afebrile BP normal Pulse 56-60 O2 sat on O2 is 97% HGB is stable Abdomen is somewhat tense from voluntary guarding She c/o generalized pain She has been able to tolerate some liquids She does not feel ready for d/c.

## 2011-09-15 MED ORDER — OXYCODONE-ACETAMINOPHEN 5-325 MG PO TABS: 1.0000 | ORAL_TABLET | Freq: Four times a day (QID) | ORAL | Status: AC | PRN

## 2011-09-15 MED ORDER — IBUPROFEN 600 MG PO TABS: 600.0000 mg | ORAL_TABLET | Freq: Four times a day (QID) | ORAL | Status: AC | PRN

## 2011-09-15 NOTE — Progress Notes (Signed)
Patient ID: Kendra [name], female   DOB: [birth year], 51 y.o.   MRN: [id number] #2 afebrile HAS LESS PAIN tolerating a diet. Is ready for d.c. The abdomen is soft and flat and the dressing is dry.

## 2011-09-15 NOTE — Progress Notes (Signed)
Pt is discharged in the care of Husband.Downstairs per ambulatory. Spirits were good. States pain is improving. Abdominal incision is clean and dry. Scanty amt of vaginal bleeding noted on V-pad.Understood all discharge instructions well.Questions asked and answered.

## 2011-09-15 NOTE — Discharge Summary (Signed)
NAMEKYNADIE, YAUN NO.:  [id number]  MEDICAL RECORD NO.:  [id number]  LOCATION:  9302                          FACILITY:  [facility]  PHYSICIAN:  Malachi Pro. Ambrose Mantle, M.D. DATE OF BIRTH:  1960/05/07  DATE OF ADMISSION:  09/13/2011 DATE OF DISCHARGE:  09/15/2011                              DISCHARGE SUMMARY   HISTORY:  This 51 year old white female, para 2-0-3-2, admitted for hysterectomy and removal of both tubes and ovaries because of a large uterine fibroid, dysmenorrhea, and heavy uterine bleeding.  The patient underwent abdominal hysterectomy and bilateral salpingo-oophorectomy under general anesthesia by Dr. Ambrose Mantle with Dr. Senaida Ores assisting. No complications were encountered during the surgery.  Postoperatively, the patient dealt with pain, but her vital signs and blood count were normal.  Her abdomen is soft, and gradually the pain lessened.  On the 2nd postop day, she was afebrile, tolerating a diet, ambulating well without difficulty, and was ready for discharge.  LABORATORY DATA:  Showed initial hemoglobin of 12.5, hematocrit 38.4, white count 16100, platelet count 286,000.  Pregnancy test was negative. Followup hemoglobin of 11.1, hematocrit 34.2, white count 13400. Pathology report is pending.  FINAL DIAGNOSES:  Leiomyomata uteri, menorrhagia, dysmenorrhea, generalized anxiety disorder, history of drug abuse, chronic pain.  OPERATION:  Abdominal hysterectomy, bilateral salpingo-oophorectomy.  FINAL CONDITION:  Improved.  INSTRUCTIONS:  Our regular discharge instructions.  No vaginal entrance. No heavy lifting or strenuous activity.  Call with any fever above 100.4 degrees.  Call with any unusual problems.  Return to the office in 4 days to have her staples removed.  MEDICATIONS:  Continue her medications that she came to the hospital with including albuterol as needed, alprazolam as needed, Celexa 20 mg a day, Crestor 20 mg a day, Restoril 30 mg  at night as needed for sleep. She is also to take Motrin 600 mg 1 tablets every 6 hours as needed for pain and Percocet 5/325, 30 tablets, 1 every 6 hours as needed for pain.     Malachi Pro. Ambrose Mantle, M.D.     TFH/MEDQ  D:  09/15/2011  T:  09/15/2011  Job:  [id number]

## 2011-10-25 ENCOUNTER — Other Ambulatory Visit: Payer: Self-pay | Admitting: Obstetrics and Gynecology

## 2011-10-26 ENCOUNTER — Other Ambulatory Visit: Payer: Self-pay | Admitting: Obstetrics and Gynecology

## 2011-10-26 DIAGNOSIS — Z1231 Encounter for screening mammogram for malignant neoplasm of breast: Secondary | ICD-10-CM

## 2011-10-26 DIAGNOSIS — Z780 Asymptomatic menopausal state: Secondary | ICD-10-CM

## 2011-12-19 ENCOUNTER — Ambulatory Visit
Admission: RE | Admit: 2011-12-19 | Discharge: 2011-12-19 | Disposition: A | Discharge status: Home / Self Care | Payer: PRIVATE HEALTH INSURANCE | Source: Ambulatory Visit | Attending: Obstetrics and Gynecology | Admitting: Obstetrics and Gynecology

## 2011-12-19 DIAGNOSIS — Z780 Asymptomatic menopausal state: Secondary | ICD-10-CM

## 2011-12-19 DIAGNOSIS — Z1231 Encounter for screening mammogram for malignant neoplasm of breast: Secondary | ICD-10-CM

## 2011-12-21 ENCOUNTER — Other Ambulatory Visit: Payer: Self-pay | Admitting: Obstetrics and Gynecology

## 2011-12-21 DIAGNOSIS — R928 Other abnormal and inconclusive findings on diagnostic imaging of breast: Secondary | ICD-10-CM

## 2012-01-02 ENCOUNTER — Ambulatory Visit
Admission: RE | Admit: 2012-01-02 | Discharge: 2012-01-02 | Disposition: A | Discharge status: Home / Self Care | Payer: PRIVATE HEALTH INSURANCE | Source: Ambulatory Visit | Attending: Obstetrics and Gynecology | Admitting: Obstetrics and Gynecology

## 2012-01-02 DIAGNOSIS — R928 Other abnormal and inconclusive findings on diagnostic imaging of breast: Secondary | ICD-10-CM

## 2012-01-18 ENCOUNTER — Ambulatory Visit: Payer: PRIVATE HEALTH INSURANCE

## 2012-01-18 ENCOUNTER — Other Ambulatory Visit: Payer: PRIVATE HEALTH INSURANCE

## 2013-04-26 ENCOUNTER — Other Ambulatory Visit: Payer: Self-pay

## 2013-04-26 DIAGNOSIS — Z1231 Encounter for screening mammogram for malignant neoplasm of breast: Secondary | ICD-10-CM

## 2013-05-01 ENCOUNTER — Ambulatory Visit
Admission: RE | Admit: 2013-05-01 | Discharge: 2013-05-01 | Disposition: A | Discharge status: Home / Self Care | Payer: PRIVATE HEALTH INSURANCE | Source: Ambulatory Visit

## 2013-05-01 DIAGNOSIS — Z1231 Encounter for screening mammogram for malignant neoplasm of breast: Secondary | ICD-10-CM

## 2013-12-05 ENCOUNTER — Other Ambulatory Visit: Payer: Self-pay | Admitting: Orthopedic Surgery

## 2013-12-31 ENCOUNTER — Encounter (HOSPITAL_BASED_OUTPATIENT_CLINIC_OR_DEPARTMENT_OTHER): Payer: Self-pay | Admitting: *Deleted

## 2013-12-31 NOTE — Progress Notes (Signed)
Pt has recent bronchitis-got a Z-pac-and cough meds-she still smokes No labs needed

## 2014-01-02 NOTE — H&P (Signed)
Kendra [name] is an 53 y.o. female.   CC / Reason for Visit: Right upper extremity problem HPI: This patient returns for reevaluation.  She reports that she is in the midst of a mild upper respiratory infection but thinks that it will be sufficiently resolved by next week for surgery.  She reaffirms that she would like to proceed with right ulnar neuroplasty at the elbow, as well as addressing her right thumb MP joint.  HPI 11-20-13:  This patient returns for reevaluation, fairly frustrated.  She continues to take gabapentin 3 times a day and intermittent tramadol and notes that the right elbow injection may have helped very slightly.  She reports pain in the right elbow, particularly posterior laterally in the gutter, and then posterior medially that she indicates along the course of the ulnar nerve.  Occasionally she reports she has numbness and tingling in the ring and small fingers as well.  Lastly, the right thumb is increasingly symptomatic at the MP joint.  HPI from 10-23-13:  This patient returns having undergone an interval NCS/EMG with Dr. Mina [name], indicating a relatively normal study without right ulnar mononeuropathy or evidence of radiculopathy.  She reports that she did take a 12 day steroid taper, and continues to take Neurontin and ibuprofen, and is plagued by pain about the region of the elbow as well as the right thumb.  She reports that the ulnar-sided pain at the elbow runs along the course of the ulnar nerve, but is also distal to the medial epicondyle and anterior to the course of the nerve.  On the lateral side, the pain is not characteristic for tennis elbow, but seems to lie in the soft spot posteriorly to the edge of the radiocapitellar joint.  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  Her symptoms date back to at least April, when she was first evaluated by Dr. Rip [name].  Over  the course of time, she has had oral steroids, and intra-articular elbow injection, and both medial and lateral epicondylar injections.  Records would indicate that the oral steroids were not very helpful, but with questioning today it appears as if they were quite helpful for at least several days before major recurrence of symptoms.  She reports that her ring and small fingers fall asleep, worse in the mornings, and at that time they are resting in a more flexed posture.  She reports altered sensibility in the hyperthenar eminence, not so much the medial forearm, and a lot of elbow pain medially along the course of the ulnar nerve.  However, she also reports pain laterally which appears to be in the soft spot and not necessarily at the lateral epicondyle.  She has a separate and distinct problem involving her right thumb that appears to be a chronic RCL insufficiency  Past Medical History  Diagnosis Date  . Anxiety   . Mental disorder   . Depression   . Bronchitis, chronic     inhaler prescribed  . Shortness of breath     on exertion, smoker  . Wears glasses   . Wears partial dentures     Past Surgical History  Procedure Laterality Date  . Mouth surgery      teeth removed  . Wisdom tooth extraction    . Knee arthroscopy  1998    rt  . Rhinoplasty  1980  . Abdominal hysterectomy  09/13/2011    Procedure: HYSTERECTOMY  ABDOMINAL;  Surgeon: Kendra Schools, MD;  Location: [facility];  Service: Gynecology;  Laterality: N/A;  Total abdominal hysterectomy    History reviewed. No pertinent family history. Social History:  reports that she has been smoking Cigarettes.  She has been smoking about 1.00 pack per day. She does not have any smokeless tobacco history on file. She reports that she does not drink alcohol or use illicit drugs.  Allergies:  Allergies  Allergen Reactions  . Sulfa Antibiotics Other (See Comments)    migraines  . Doxycycline Hives and Rash    No prescriptions prior to  admission    No results found for this or any previous visit (from the past 48 hour(s)). No results found.  Review of Systems  All other systems reviewed and are negative.   Height 5\' 7"  (1.702 m), weight 52.164 kg (115 lb), last menstrual period 08/29/2011. Physical Exam  Constitutional:  WD, WN, NAD HEENT:  NCAT, EOMI Neuro/Psych:  Alert & oriented to person, place, and time; appropriate mood & affect Lymphatic: No generalized UE edema or lymphadenopathy Extremities / MSK:  Both UE are normal with respect to appearance, ranges of motion, joint stability, muscle strength/tone, sensation, & perfusion except as otherwise noted:  She is much less jumpy to examination today than she has been in the past.  She does seem to have full elbow flexion and extension, pronation supination.  There is no significant edema.  Although there remains some irritability with the ulnar nerve in the retro-condylar groove and just distal to it.  There is tenderness laterally at the distal a posterior edge of the radiocapitellar joint.  She can detect a 2.8 3 monofilament across all the digits, but has subjective altered sensibility in the ulnar distribution with appropriate ring finger splitting.  The right thumb MP joint is enlarged and rests slightly ulnarly deviated, palmarly subluxed.  There is pain in the joint with manipulation.  Labs / X-rays:  3 views of the right thumb ordered and obtained today reveals palmar radial subluxation at the MP joint  Assessment:  1.  Probable right ulnar neuropathy at the elbow 2.  Right lateral elbow pain of unclear etiology 3.  Right thumb MP joint chronic RCL insufficiency, likely with some degenerative change  Plan: We discussed these findings.  With regard to the ulnar nerve, we discussed decompression in situ and subcutaneous transposition if necessary based upon intraoperative findings.  With regard to the thumb MP joint, we discussed in contemplated ligament  repair versus reconstruction if the chondral surfaces appear pristine.  If, however, the joint surfaces appear significantly degenerative, then I recommend fusion given the 2 planes of present instability.  She verbalized understanding and a willingness to proceed.  We also discussed what she might expect with regard to management of work around the time of surgery.  Questions were invited and answered.  In addition to the goal of the procedure, the risks of the procedure to include but not limited to bleeding; infection; damage to the nerves or blood vessels that could result in bleeding, numbness, weakness, chronic pain, and the need for additional procedures; stiffness; the need for revision surgery; and anesthetic risks, were reviewed.  No specific outcome was guaranteed or implied.  Informed consent was obtained.  We also provided a prescription for her for analgesics to be saved and used postoperatively.  Lilymae Swiech A. 01/02/2014, 8:24 AM

## 2014-01-06 ENCOUNTER — Ambulatory Visit (HOSPITAL_BASED_OUTPATIENT_CLINIC_OR_DEPARTMENT_OTHER): Payer: PRIVATE HEALTH INSURANCE | Admitting: Anesthesiology

## 2014-01-06 ENCOUNTER — Encounter (HOSPITAL_BASED_OUTPATIENT_CLINIC_OR_DEPARTMENT_OTHER): Payer: Self-pay | Admitting: Anesthesiology

## 2014-01-06 ENCOUNTER — Ambulatory Visit (HOSPITAL_BASED_OUTPATIENT_CLINIC_OR_DEPARTMENT_OTHER)
Admission: RE | Admit: 2014-01-06 | Discharge: 2014-01-06 | Disposition: A | Discharge status: Home / Self Care | Payer: PRIVATE HEALTH INSURANCE | Source: Ambulatory Visit | Attending: Orthopedic Surgery | Admitting: Orthopedic Surgery

## 2014-01-06 ENCOUNTER — Ambulatory Visit (HOSPITAL_COMMUNITY): Payer: PRIVATE HEALTH INSURANCE

## 2014-01-06 ENCOUNTER — Encounter (HOSPITAL_BASED_OUTPATIENT_CLINIC_OR_DEPARTMENT_OTHER)
Admission: RE | Disposition: A | Discharge status: Home / Self Care | Payer: Self-pay | Source: Ambulatory Visit | Attending: Orthopedic Surgery

## 2014-01-06 DIAGNOSIS — M13841 Other specified arthritis, right hand: Secondary | ICD-10-CM | POA: Insufficient documentation

## 2014-01-06 DIAGNOSIS — G5621 Lesion of ulnar nerve, right upper limb: Secondary | ICD-10-CM | POA: Diagnosis present

## 2014-01-06 DIAGNOSIS — F329 Major depressive disorder, single episode, unspecified: Secondary | ICD-10-CM | POA: Diagnosis not present

## 2014-01-06 DIAGNOSIS — F419 Anxiety disorder, unspecified: Secondary | ICD-10-CM | POA: Insufficient documentation

## 2014-01-06 DIAGNOSIS — G8918 Other acute postprocedural pain: Secondary | ICD-10-CM | POA: Insufficient documentation

## 2014-01-06 DIAGNOSIS — F17210 Nicotine dependence, cigarettes, uncomplicated: Secondary | ICD-10-CM | POA: Insufficient documentation

## 2014-01-06 DIAGNOSIS — M1990 Unspecified osteoarthritis, unspecified site: Secondary | ICD-10-CM

## 2014-01-06 HISTORY — DX: Presence of dental prosthetic device (complete) (partial): Z97.2

## 2014-01-06 HISTORY — PX: FINGER ARTHRODESIS: SHX5000

## 2014-01-06 HISTORY — PX: ULNAR NERVE TRANSPOSITION: SHX2595

## 2014-01-06 HISTORY — DX: Presence of spectacles and contact lenses: Z97.3

## 2014-01-06 LAB — POCT HEMOGLOBIN-HEMACUE: HEMOGLOBIN: 14.2 g/dL (ref 12.0–15.0)

## 2014-01-06 SURGERY — ULNAR NERVE DECOMPRESSION/TRANSPOSITION
Anesthesia: General | Laterality: Right

## 2014-01-06 MED ORDER — CEFAZOLIN SODIUM-DEXTROSE 2-3 GM-% IV SOLR
INTRAVENOUS | Status: DC | PRN
  Administered 2014-01-06: 2 g via INTRAVENOUS

## 2014-01-06 MED ORDER — GLYCOPYRROLATE 0.2 MG/ML IJ SOLN
INTRAMUSCULAR | Status: DC | PRN
  Administered 2014-01-06: 0.2 mg via INTRAVENOUS

## 2014-01-06 MED ORDER — ONDANSETRON HCL 4 MG/2ML IJ SOLN: 4.0000 mg | Freq: Once | INTRAMUSCULAR | Status: DC | PRN

## 2014-01-06 MED ORDER — LACTATED RINGERS IV SOLN: INTRAVENOUS | Status: DC

## 2014-01-06 MED ORDER — ONDANSETRON HCL 4 MG/2ML IJ SOLN
INTRAMUSCULAR | Status: DC | PRN
  Administered 2014-01-06: 4 mg via INTRAVENOUS

## 2014-01-06 MED ORDER — HYDROMORPHONE HCL 1 MG/ML IJ SOLN: 0.2500 mg | INTRAMUSCULAR | Status: DC | PRN

## 2014-01-06 MED ORDER — FENTANYL CITRATE 0.05 MG/ML IJ SOLN
INTRAMUSCULAR | Status: AC
  Filled 2014-01-06: qty 2

## 2014-01-06 MED ORDER — OXYCODONE HCL 5 MG/5ML PO SOLN: 5.0000 mg | Freq: Once | ORAL | Status: DC | PRN

## 2014-01-06 MED ORDER — PROPOFOL 10 MG/ML IV EMUL
INTRAVENOUS | Status: AC
  Filled 2014-01-06: qty 100

## 2014-01-06 MED ORDER — BUPIVACAINE-EPINEPHRINE (PF) 0.5% -1:200000 IJ SOLN
INTRAMUSCULAR | Status: DC | PRN
  Administered 2014-01-06: 23 mL via PERINEURAL

## 2014-01-06 MED ORDER — LIDOCAINE HCL 2 % IJ SOLN
INTRAMUSCULAR | Status: AC
  Filled 2014-01-06: qty 20

## 2014-01-06 MED ORDER — 0.9 % SODIUM CHLORIDE (POUR BTL) OPTIME
TOPICAL | Status: DC | PRN
  Administered 2014-01-06: 1000 mL

## 2014-01-06 MED ORDER — EPHEDRINE SULFATE 50 MG/ML IJ SOLN
INTRAMUSCULAR | Status: DC | PRN
  Administered 2014-01-06 (×2): 10 mg via INTRAVENOUS

## 2014-01-06 MED ORDER — BUPIVACAINE HCL (PF) 0.25 % IJ SOLN
INTRAMUSCULAR | Status: AC
  Filled 2014-01-06: qty 90

## 2014-01-06 MED ORDER — CEFAZOLIN SODIUM-DEXTROSE 2-3 GM-% IV SOLR
INTRAVENOUS | Status: AC
  Filled 2014-01-06: qty 50

## 2014-01-06 MED ORDER — MIDAZOLAM HCL 2 MG/2ML IJ SOLN
INTRAMUSCULAR | Status: AC
  Filled 2014-01-06: qty 2

## 2014-01-06 MED ORDER — LIDOCAINE HCL (CARDIAC) 20 MG/ML IV SOLN
INTRAVENOUS | Status: DC | PRN
Start: 2014-01-06 — End: 2014-01-06
  Administered 2014-01-06: 100 mg via INTRAVENOUS

## 2014-01-06 MED ORDER — FENTANYL CITRATE 0.05 MG/ML IJ SOLN
INTRAMUSCULAR | Status: AC
  Filled 2014-01-06: qty 6

## 2014-01-06 MED ORDER — BUPIVACAINE-EPINEPHRINE (PF) 0.5% -1:200000 IJ SOLN
INTRAMUSCULAR | Status: AC
  Filled 2014-01-06: qty 60

## 2014-01-06 MED ORDER — DEXAMETHASONE SODIUM PHOSPHATE 4 MG/ML IJ SOLN
INTRAMUSCULAR | Status: DC | PRN
  Administered 2014-01-06: 10 mg via INTRAVENOUS

## 2014-01-06 MED ORDER — MIDAZOLAM HCL 5 MG/5ML IJ SOLN
INTRAMUSCULAR | Status: DC | PRN
  Administered 2014-01-06: 2 mg via INTRAVENOUS

## 2014-01-06 MED ORDER — OXYCODONE HCL 5 MG PO TABS: 5.0000 mg | ORAL_TABLET | Freq: Once | ORAL | Status: DC | PRN

## 2014-01-06 MED ORDER — LACTATED RINGERS IV SOLN
INTRAVENOUS | Status: DC
  Administered 2014-01-06 (×2): via INTRAVENOUS

## 2014-01-06 MED ORDER — FENTANYL CITRATE 0.05 MG/ML IJ SOLN
INTRAMUSCULAR | Status: DC | PRN
  Administered 2014-01-06 (×2): 50 ug via INTRAVENOUS

## 2014-01-06 MED ORDER — MIDAZOLAM HCL 2 MG/2ML IJ SOLN
1.0000 mg | INTRAMUSCULAR | Status: DC | PRN
  Administered 2014-01-06: 2 mg via INTRAVENOUS

## 2014-01-06 MED ORDER — PROPOFOL 10 MG/ML IV BOLUS
INTRAVENOUS | Status: AC
  Filled 2014-01-06: qty 80

## 2014-01-06 MED ORDER — CEFAZOLIN SODIUM-DEXTROSE 2-3 GM-% IV SOLR: 2.0000 g | INTRAVENOUS | Status: DC

## 2014-01-06 MED ORDER — PROPOFOL 10 MG/ML IV BOLUS
INTRAVENOUS | Status: DC | PRN
  Administered 2014-01-06: 200 mg via INTRAVENOUS

## 2014-01-06 MED ORDER — FENTANYL CITRATE 0.05 MG/ML IJ SOLN
50.0000 ug | INTRAMUSCULAR | Status: DC | PRN
  Administered 2014-01-06: 100 ug via INTRAVENOUS

## 2014-01-06 SURGICAL SUPPLY — 81 items
APL SKNCLS STERI-STRIP NONHPOA (GAUZE/BANDAGES/DRESSINGS) ×1
BENZOIN TINCTURE PRP APPL 2/3 (GAUZE/BANDAGES/DRESSINGS) ×3 IMPLANT
BLADE AVERAGE 25MMX9MM (BLADE)
BLADE AVERAGE 25X9 (BLADE) IMPLANT
BLADE MINI RND TIP GREEN BEAV (BLADE) IMPLANT
BLADE SURG 15 STRL LF DISP TIS (BLADE) ×1 IMPLANT
BLADE SURG 15 STRL SS (BLADE) ×3
BNDG CMPR 9X4 STRL LF SNTH (GAUZE/BANDAGES/DRESSINGS)
BNDG COHESIVE 1X5 TAN STRL LF (GAUZE/BANDAGES/DRESSINGS) IMPLANT
BNDG COHESIVE 4X5 TAN STRL (GAUZE/BANDAGES/DRESSINGS) ×3 IMPLANT
BNDG CONFORM 2 STRL LF (GAUZE/BANDAGES/DRESSINGS) IMPLANT
BNDG ESMARK 4X9 LF (GAUZE/BANDAGES/DRESSINGS) IMPLANT
BNDG GAUZE ELAST 4 BULKY (GAUZE/BANDAGES/DRESSINGS) ×4 IMPLANT
BUR ROUND CARBIDE (BURR) IMPLANT
CHLORAPREP W/TINT 26ML (MISCELLANEOUS) ×3 IMPLANT
CLOSURE WOUND 1/2 X4 (GAUZE/BANDAGES/DRESSINGS) ×1
CORDS BIPOLAR (ELECTRODE) ×3 IMPLANT
COVER BACK TABLE 60X90IN (DRAPES) ×3 IMPLANT
COVER MAYO STAND STRL (DRAPES) ×3 IMPLANT
CUFF TOURNIQUET SINGLE 18IN (TOURNIQUET CUFF) ×2 IMPLANT
DRAIN PENROSE 1/2X12 LTX STRL (WOUND CARE) IMPLANT
DRAIN PENROSE 1/4X12 LTX STRL (WOUND CARE) IMPLANT
DRAPE C-ARM 42X72 X-RAY (DRAPES) ×3 IMPLANT
DRAPE EXTREMITY T 121X128X90 (DRAPE) ×3 IMPLANT
DRAPE SURG 17X23 STRL (DRAPES) ×3 IMPLANT
DRAPE U-SHAPE 47X51 STRL (DRAPES) IMPLANT
DRSG ADAPTIC 3X8 NADH LF (GAUZE/BANDAGES/DRESSINGS) ×2 IMPLANT
DRSG EMULSION OIL 3X3 NADH (GAUZE/BANDAGES/DRESSINGS) ×3 IMPLANT
ELECT REM PT RETURN 9FT ADLT (ELECTROSURGICAL)
ELECTRODE REM PT RTRN 9FT ADLT (ELECTROSURGICAL) IMPLANT
GAUZE SPONGE 4X4 12PLY STRL (GAUZE/BANDAGES/DRESSINGS) ×3 IMPLANT
GLOVE BIO SURGEON STRL SZ7.5 (GLOVE) ×3 IMPLANT
GLOVE BIOGEL PI IND STRL 7.0 (GLOVE) ×1 IMPLANT
GLOVE BIOGEL PI IND STRL 8 (GLOVE) ×1 IMPLANT
GLOVE BIOGEL PI INDICATOR 7.0 (GLOVE) ×2
GLOVE BIOGEL PI INDICATOR 8 (GLOVE) ×2
GLOVE ECLIPSE 6.5 STRL STRAW (GLOVE) ×3 IMPLANT
GOWN STRL REUS W/ TWL LRG LVL3 (GOWN DISPOSABLE) ×1 IMPLANT
GOWN STRL REUS W/TWL LRG LVL3 (GOWN DISPOSABLE) ×3
GOWN STRL REUS W/TWL XL LVL3 (GOWN DISPOSABLE) ×3 IMPLANT
GUIDE WIRE 1.6 ×2 IMPLANT
IMPLANT SM MET POST (Screw) ×2 IMPLANT
LOOP VESSEL MAXI BLUE (MISCELLANEOUS) IMPLANT
LOOP VESSEL MINI RED (MISCELLANEOUS) IMPLANT
MET REAMER ×2 IMPLANT
NDL HYPO 25X1 1.5 SAFETY (NEEDLE) IMPLANT
NEEDLE HYPO 22GX1.5 SAFETY (NEEDLE) IMPLANT
NEEDLE HYPO 25X1 1.5 SAFETY (NEEDLE) IMPLANT
NS IRRIG 1000ML POUR BTL (IV SOLUTION) ×3 IMPLANT
PACK BASIN DAY SURGERY FS (CUSTOM PROCEDURE TRAY) ×3 IMPLANT
PADDING CAST ABS 4INX4YD NS (CAST SUPPLIES) ×2
PADDING CAST ABS COTTON 4X4 ST (CAST SUPPLIES) IMPLANT
PENCIL BUTTON HOLSTER BLD 10FT (ELECTRODE) IMPLANT
RUBBERBAND STERILE (MISCELLANEOUS) IMPLANT
SCREW LAG 4.0X24 (Screw) ×2 IMPLANT
SLING ARM LRG ADULT FOAM STRAP (SOFTGOODS) ×2 IMPLANT
SLING ARM MED ADULT FOAM STRAP (SOFTGOODS) IMPLANT
SLING ARM XL FOAM STRAP (SOFTGOODS) IMPLANT
SPLINT PLASTER CAST XFAST 4X15 (CAST SUPPLIES) IMPLANT
SPLINT PLASTER XTRA FAST SET 4 (CAST SUPPLIES) ×2
SPONGE GAUZE 4X4 12PLY STER LF (GAUZE/BANDAGES/DRESSINGS) ×3 IMPLANT
STOCKINETTE 6  STRL (DRAPES) ×2
STOCKINETTE 6 STRL (DRAPES) ×1 IMPLANT
STRIP CLOSURE SKIN 1/2X4 (GAUZE/BANDAGES/DRESSINGS) ×2 IMPLANT
SUT ETHILON 8 0 BV130 4 (SUTURE) IMPLANT
SUT STEEL 2 (SUTURE) IMPLANT
SUT STEEL 4 (SUTURE) IMPLANT
SUT STEEL 5 (SUTURE) IMPLANT
SUT VIC AB 0 SH 27 (SUTURE) IMPLANT
SUT VIC AB 2-0 SH 27 (SUTURE)
SUT VIC AB 2-0 SH 27XBRD (SUTURE) IMPLANT
SUT VIC AB 3-0 SH 27 (SUTURE) ×3
SUT VIC AB 3-0 SH 27X BRD (SUTURE) ×1 IMPLANT
SUT VICRYL 3-0 RB1 (SUTURE) ×3 IMPLANT
SUT VICRYL RAPIDE 4-0 (SUTURE) ×2 IMPLANT
SUT VICRYL RAPIDE 4/0 PS 2 (SUTURE) IMPLANT
SYR BULB 3OZ (MISCELLANEOUS) ×3 IMPLANT
SYRINGE 10CC LL (SYRINGE) IMPLANT
TOWEL OR 17X24 6PK STRL BLUE (TOWEL DISPOSABLE) ×3 IMPLANT
TOWEL OR NON WOVEN STRL DISP B (DISPOSABLE) ×3 IMPLANT
UNDERPAD 30X30 INCONTINENT (UNDERPADS AND DIAPERS) ×3 IMPLANT

## 2014-01-06 NOTE — Anesthesia Preprocedure Evaluation (Addendum)
Anesthesia Evaluation  Patient identified by MRN, date of birth, ID band Patient awake    Reviewed: Allergy & Precautions, H&P , NPO status , Patient's Chart, lab work & pertinent test results  History of Anesthesia Complications (+) history of anesthetic complications  Airway Mallampati: I  TM Distance: >3 FB Neck ROM: Full    Dental  (+) Teeth Intact, Dental Advisory Given   Pulmonary Current Smoker,  breath sounds clear to auscultation        Cardiovascular Rhythm:Regular Rate:Normal     Neuro/Psych    GI/Hepatic   Endo/Other    Renal/GU      Musculoskeletal   Abdominal   Peds  Hematology   Anesthesia Other Findings   Reproductive/Obstetrics                          Anesthesia Physical Anesthesia Plan  ASA: II  Anesthesia Plan: General   Post-op Pain Management:    Induction: Intravenous  Airway Management Planned: LMA  Additional Equipment:   Intra-op Plan:   Post-operative Plan: Extubation in OR  Informed Consent: I have reviewed the patients History and Physical, chart, labs and discussed the procedure including the risks, benefits and alternatives for the proposed anesthesia with the patient or authorized representative who has indicated his/her understanding and acceptance.   Dental advisory given  Plan Discussed with: CRNA, Anesthesiologist and Surgeon  Anesthesia Plan Comments:         Anesthesia Quick Evaluation

## 2014-01-06 NOTE — Interval H&P Note (Signed)
History and Physical Interval Note:  01/06/2014 9:36 AM  Michaelyn Barter  has presented today for surgery, with the diagnosis of RIGHT CUBITAL TUNNEL SYNDROME AND RIGHT THUMB OSTEOARTHRITIS  The various methods of treatment have been discussed with the patient and family. After consideration of risks, benefits and other options for treatment, the patient has consented to  Procedure(s): RIGHT ULNA NEOPLASTY AT [place] (Right) ARTHRODESIS FINGER (Right) as a surgical intervention .  The patient's history has been reviewed, patient examined, no change in status, stable for surgery.  I have reviewed the patient's chart and labs.  Questions were answered to the patient's satisfaction.     Drayton Tieu A.

## 2014-01-06 NOTE — Op Note (Signed)
01/06/2014  9:36 AM  PATIENT:  Kendra [name]  53 y.o. female  PRE-OPERATIVE DIAGNOSIS:  Right ulnar neuropathy and thumb MCP arthritis  POST-OPERATIVE DIAGNOSIS:  Same  PROCEDURE:  Right ulnar neuroplasty at the elbow (decompressing in situ) and right thumb MCP arthrodesis  SURGEON: Rayvon Char. Grandville Silos, MD  PHYSICIAN ASSISTANT: Morley Kos, OPA-C  ANESTHESIA:  regional and general  SPECIMENS:  None  DRAINS:   None  EBL:  less than 50 mL  PREOPERATIVE INDICATIONS:  Kendra [name] is a  53 y.o. female with signs and symptoms of right ulnar neuropathy that has failed to resolve with nonoperative management. In addition she has persistent pain in the right thumb due to degenerative changes at the MCP joint. It responded transiently to steroid injection.  The risks benefits and alternatives were discussed with the patient preoperatively including but not limited to the risks of infection, bleeding, nerve injury, cardiopulmonary complications, the need for revision surgery, among others, and the patient verbalized understanding and consented to proceed.  OPERATIVE IMPLANTS: Extremity medical MCP fusion device, the larger proximal screw and a 26mm distal screw  OPERATIVE PROCEDURE:  After receiving prophylactic antibiotics and a regional block, the patient was escorted to the operative theatre and placed in a supine position. Gen. anesthesia was administered A surgical "time-out" was performed during which the planned procedure, proposed operative site, and the correct patient identity were compared to the operative consent and agreement confirmed by the circulating nurse according to current facility policy.  Following application of a tourniquet to the operative extremity, the exposed skin was prepped with Chloraprep and draped in the usual sterile fashion.  The limb was exsanguinated with an Esmarch bandage and the tourniquet inflated to approximately 147mmHg higher than systolic  BP.  Incisions were marked over the course of the ulnar nerve at the elbow as well as over the dorsum of the MCP joint. Both were curvilinear. The elbow was addressed first. Skin was incised sharply with scalpel subcutaneoustissues were dissected with blunt spreading dissection, looking for any cutaneous nerve branches crossing the incision transversely. One small one was identified and protected. The ulnar nerve was identified in the retrocondylar groove and exposed at that level. The superficial and deep fascia of the FCU was split over the nerve decompressing it fully from the retrocondylar groove distally.  The nerve was then decompressed proximally, requiring a second accessory incision in the mid forearm to fully decompress the nerve until it reached its position in the anterior compartment.  Attention was then directed to the thumb with a skin was incised sharply with scalpel, subcutaneous tissues were dissected with blunt spreading dissection. The extensor mechanism was split between the EP only baby B tendons and reflected radially and ulnarly. The capsule was split in the joint was inspected, found to be arthritic with patches of bone on bone. Using fluoroscopic guidance, K wire was driven into the metacarpal head down the shaft. Hand reamer was then used to create a path for the screw.  The smaller proximal screw was inserted, found to be not sufficiently snugly and swabbed for the larger one. Once the screw was then placed into the proper orientation, the plain gut was used to flatten the distal aspect of the metacarpal a 25 angle. The proximal phalanx, guidepin was driven in the same fashion, screw length measured, and then the screw was placed and the surface planed down to cancellous bone with the planer. The surfaces were approximated and the distal screw  placed. The digit was held from rotating the screw was sufficiently tightened. There was good bony apposition. Final images were obtained and  the wound was copiously irrigated. Tourniquet was released some additional hemostasis was obtained at all 3 incisions. The proximal incisions were then closed with 3-0 Vicryl deep dermal buried sutures in a running 4-0 Vicryl repeat horizontal mattress suture and the skin. For the thumb, 3-0 Vicryl was used to repair the capsule separately from reapproximated in the extensor mechanism and the skin was closed with 4-0 Vicryl Rapide running horizontal mattress suture. Long arm splint dressing was applied with a thumb spica plaster component and she was awakened and taken to the recovery room in stable condition gonorrhea spontaneously  DISPOSITION: She'll be discharged home today with a sling and typical instructions return in 7-10 days for reevaluation, at which time she should have x-rays of the right thumb out of the splint and conversion to a short arm thumb spica cast.

## 2014-01-06 NOTE — Progress Notes (Signed)
Assisted Dr. Crews with right, ultrasound guided, supraclavicular block. Side rails up, monitors on throughout procedure. See vital signs in flow sheet. Tolerated Procedure well. 

## 2014-01-06 NOTE — Transfer of Care (Signed)
Immediate Anesthesia Transfer of Care Note  Patient: Kendra [name]  Procedure(s) Performed: Procedure(s): RIGHT ULNA NEOPLASTY AT ELBOW AND THUMB METACARPAL PHALANGEAL JOINT FUSION (Right) ARTHRODESIS FINGER (Right)  Patient Location: PACU  Anesthesia Type:GA combined with regional for post-op pain  Level of Consciousness: awake, alert , oriented and patient cooperative  Airway & Oxygen Therapy: Patient Spontanous Breathing and Patient connected to face mask oxygen  Post-op Assessment: Report given to PACU RN and Post -op Vital signs reviewed and stable  Post vital signs: Reviewed and stable  Complications: No apparent anesthesia complications

## 2014-01-06 NOTE — Discharge Instructions (Signed)
Discharge Instructions   You have a dressing with a plaster splint incorporated in it. Move your fingers as much as possible, making a full fist and fully opening the fist. Elevate your hand to reduce pain & swelling of the digits.  Ice over the operative site may be helpful to reduce pain & swelling.  DO NOT USE HEAT. Pain medicine has been prescribed for you.  Use your medicine as needed over the first 48 hours, and then you can begin to taper your use.  You may use Tylenol in place of your prescribed pain medication, but not IN ADDITION to it. Leave the dressing in place until you return to our office.  You may shower, but keep the bandage clean & dry.  You may drive a car when you are off of prescription pain medications and can safely control your vehicle with both hands.   Please call [phone] during normal business hours or [phone] after hours for any problems. Including the following:  - excessive redness of the incisions - drainage for more than 4 days - fever of more than 101.5 F  *Please note that pain medications will not be refilled after hours or on weekends.  Regional Anesthesia Blocks  1. Numbness or the inability to move the "blocked" extremity may last from 3-48 hours after placement. The length of time depends on the medication injected and your individual response to the medication. If the numbness is not going away after 48 hours, call your surgeon.  2. The extremity that is blocked will need to be protected until the numbness is gone and the  Strength has returned. Because you cannot feel it, you will need to take extra care to avoid injury. Because it may be weak, you may have difficulty moving it or using it. You may not know what position it is in without looking at it while the block is in effect.  3. For blocks in the legs and feet, returning to weight bearing and walking needs to be done carefully. You will need to wait until the numbness is entirely  gone and the strength has returned. You should be able to move your leg and foot normally before you try and bear weight or walk. You will need someone to be with you when you first try to ensure you do not fall and possibly risk injury.  4. Bruising and tenderness at the needle site are common side effects and will resolve in a few days.  5. Persistent numbness or new problems with movement should be communicated to the surgeon or the [facility] [phone] [place] ([phone]).   Post Anesthesia Home Care Instructions  Activity: Get plenty of rest for the remainder of the day. A responsible adult should stay with you for 24 hours following the procedure.  For the next 24 hours, DO NOT: -Drive a car -Paediatric nurse -Drink alcoholic beverages -Take any medication unless instructed by your physician -Make any legal decisions or sign important papers.  Meals: Start with liquid foods such as gelatin or soup. Progress to regular foods as tolerated. Avoid greasy, spicy, heavy foods. If nausea and/or vomiting occur, drink only clear liquids until the nausea and/or vomiting subsides. Call your physician if vomiting continues.  Special Instructions/Symptoms: Your throat may feel dry or sore from the anesthesia or the breathing tube placed in your throat during surgery. If this causes discomfort, gargle with warm salt water. The discomfort should disappear within 24 hours.

## 2014-01-06 NOTE — Anesthesia Postprocedure Evaluation (Signed)
  Anesthesia Post-op Note  Patient: Kendra [name]  Procedure(s) Performed: Procedure(s): RIGHT ULNA NEOPLASTY AT ELBOW AND THUMB METACARPAL PHALANGEAL JOINT FUSION (Right) ARTHRODESIS FINGER (Right)  Patient Location: PACU  Anesthesia Type: General with Supraclavicular Block for post op pain   Level of Consciousness: awake, alert  and oriented  Airway and Oxygen Therapy: Patient Spontanous Breathing  Post-op Pain: none  Post-op Assessment: Post-op Vital signs reviewed  Post-op Vital Signs: Reviewed  Last Vitals:  Filed Vitals:   01/06/14 1158  BP: 125/62  Pulse: 60  Temp: 36.7 C  Resp: 18    Complications: No apparent anesthesia complications

## 2014-01-06 NOTE — Anesthesia Procedure Notes (Addendum)
Anesthesia Regional Block:  Supraclavicular block  Pre-Anesthetic Checklist: ,, timeout performed, Correct Patient, Correct Site, Correct Laterality, Correct Procedure, Correct Position, site marked, Risks and benefits discussed,  Surgical consent,  Pre-op evaluation,  At surgeon's request and post-op pain management  Laterality: Right and Upper  Prep: chloraprep       Needles:  Injection technique: Single-shot  Needle Type: Echogenic Stimulator Needle     Needle Length: 5cm 5 cm Needle Gauge: 21 and 21 G    Additional Needles:  Procedures: ultrasound guided (picture in chart) Supraclavicular block Narrative:  Start time: 01/06/2014 9:02 AM End time: 01/06/2014 9:10 AM Injection made incrementally with aspirations every 5 mL.  Performed by: Personally  Anesthesiologist: CREWS, DAVID A   Procedure Name: LMA Insertion Date/Time: 01/06/2014 9:43 AM Performed by: Marrianne Mood Pre-anesthesia Checklist: Patient identified, Emergency Drugs available, Suction available, Patient being monitored and Timeout performed Patient Re-evaluated:Patient Re-evaluated prior to inductionOxygen Delivery Method: Circle System Utilized Preoxygenation: Pre-oxygenation with 100% oxygen Intubation Type: IV induction Ventilation: Mask ventilation without difficulty LMA: LMA inserted LMA Size: 4.0 Number of attempts: 1 Airway Equipment and Method: bite block Placement Confirmation: positive ETCO2 Tube secured with: Tape Dental Injury: Teeth and Oropharynx as per pre-operative assessment

## 2014-01-07 ENCOUNTER — Encounter (HOSPITAL_BASED_OUTPATIENT_CLINIC_OR_DEPARTMENT_OTHER): Payer: Self-pay | Admitting: Orthopedic Surgery

## 2014-01-08 NOTE — Progress Notes (Signed)
Due to pt denying her previous of Hepatitis C, I called Dr. Shirline Frees, her PCP, and he agreed it would be good to check up on her and try to confirm this as a problem.  He will be in touch with her.

## 2014-01-08 NOTE — Addendum Note (Signed)
Addendum  created 01/08/14 1005 by Napoleon Form, MD   Modules edited: Clinical Notes   Clinical Notes:  File: [id number]

## 2014-03-19 ENCOUNTER — Other Ambulatory Visit: Payer: Self-pay

## 2014-03-19 DIAGNOSIS — Z1231 Encounter for screening mammogram for malignant neoplasm of breast: Secondary | ICD-10-CM

## 2014-04-04 ENCOUNTER — Other Ambulatory Visit: Payer: Self-pay | Admitting: Obstetrics and Gynecology

## 2014-04-04 DIAGNOSIS — N6452 Nipple discharge: Secondary | ICD-10-CM

## 2014-04-04 DIAGNOSIS — R922 Inconclusive mammogram: Secondary | ICD-10-CM

## 2014-04-04 DIAGNOSIS — M8580 Other specified disorders of bone density and structure, unspecified site: Secondary | ICD-10-CM

## 2014-04-07 ENCOUNTER — Ambulatory Visit
Admission: RE | Admit: 2014-04-07 | Discharge: 2014-04-07 | Disposition: A | Discharge status: Home / Self Care | Payer: PRIVATE HEALTH INSURANCE | Source: Ambulatory Visit | Attending: Obstetrics and Gynecology | Admitting: Obstetrics and Gynecology

## 2014-04-07 ENCOUNTER — Other Ambulatory Visit: Payer: Self-pay | Admitting: Obstetrics and Gynecology

## 2014-04-07 DIAGNOSIS — N6452 Nipple discharge: Secondary | ICD-10-CM

## 2014-04-07 DIAGNOSIS — R922 Inconclusive mammogram: Secondary | ICD-10-CM

## 2014-04-07 DIAGNOSIS — M8580 Other specified disorders of bone density and structure, unspecified site: Secondary | ICD-10-CM

## 2014-04-22 ENCOUNTER — Other Ambulatory Visit (HOSPITAL_COMMUNITY): Payer: Self-pay | Admitting: *Deleted

## 2014-04-22 DIAGNOSIS — N6452 Nipple discharge: Secondary | ICD-10-CM

## 2014-04-22 DIAGNOSIS — N6320 Unspecified lump in the left breast, unspecified quadrant: Secondary | ICD-10-CM

## 2014-05-08 ENCOUNTER — Ambulatory Visit
Admission: RE | Admit: 2014-05-08 | Discharge: 2014-05-08 | Disposition: A | Discharge status: Home / Self Care | Payer: No Typology Code available for payment source | Source: Ambulatory Visit | Attending: Obstetrics and Gynecology | Admitting: Obstetrics and Gynecology

## 2014-05-08 ENCOUNTER — Ambulatory Visit (HOSPITAL_COMMUNITY)
Admission: RE | Admit: 2014-05-08 | Discharge: 2014-05-08 | Disposition: A | Discharge status: Home / Self Care | Payer: PRIVATE HEALTH INSURANCE | Source: Ambulatory Visit | Attending: Obstetrics and Gynecology | Admitting: Obstetrics and Gynecology

## 2014-05-08 ENCOUNTER — Encounter (HOSPITAL_COMMUNITY): Payer: Self-pay

## 2014-05-08 VITALS — BP 100/64 | Temp 98.2°F | Ht 66.5 in | Wt 125.8 lb

## 2014-05-08 DIAGNOSIS — N6452 Nipple discharge: Secondary | ICD-10-CM

## 2014-05-08 DIAGNOSIS — N6320 Unspecified lump in the left breast, unspecified quadrant: Secondary | ICD-10-CM

## 2014-05-08 DIAGNOSIS — Z1239 Encounter for other screening for malignant neoplasm of breast: Secondary | ICD-10-CM

## 2014-05-08 DIAGNOSIS — N6325 Unspecified lump in the left breast, overlapping quadrants: Secondary | ICD-10-CM

## 2014-05-08 HISTORY — DX: Age-related osteoporosis without current pathological fracture: M81.0

## 2014-05-08 NOTE — Patient Instructions (Signed)
Educational materials given on self-breast awareness. Explained to Kendra [name] that she no longer needs Pap smears due to her history of a hysterectomy for benign reasons. Referred patient to the [facility] for diagnostic mammogram and possible left breast ultrasound. Appointment scheduled for Thursday, May 08, 2014 at 1330. Patient aware of appointment and will be there. Let patient know will follow up with her within the next couple weeks with results of breast discharge by phone. Smoking cessation discussed with patient and resources given. [name] verbalized understanding.  Brannock, Arvil Chaco, RN 12:28 PM

## 2014-05-08 NOTE — Progress Notes (Addendum)
Complaints of left breast lump x 1 month. Patient complained of bilateral milky breast discharge when expresses breast since last child was born that had turned bloody to greenish colored within the left breast.  Pap Smear:  Pap smear not completed today. Last Pap smear was in 2013 at Dr. Marijean Heath office and normal. Per patient has a history of an abnormal Pap smear around 30 years ago that required a colposcopy and cryotherapy for follow-up. Per patient has not had any abnormal Pap smears since cryotherapy. Patient has a history of a complete hysterectomy 09/13/2011 related to fibroids and DUB. No further Pap smears are needed per ACOG and BCCCP due to patients history of a hysterectomy for benign reasons. No Pap smear results in EPIC.    Physical exam: Breasts Breasts symmetrical. No skin abnormalities bilateral breasts. No nipple retraction bilateral breasts. Bilateral greenish colored nipple discharge expressed on exam. Sample of breast discharge from each breast sent to cytology. No lymphadenopathy. No lumps palpated right breast. Palpated a moveable pea sized lump within the left breast at 12 o'clock 6 cm from the nipple. Complaints of left outer and upper breast tenderness on exam. Referred patient to the [facility] for diagnostic mammogram and possible left breast ultrasound. Appointment scheduled for Thursday, May 08, 2014 at 1330.  Pelvic/Bimanual No Pap smear completed today since patient has a history of a hysterectomy for benign reasons. Pap smear not indicated per BCCCP guidelines.   Smoking cessation discussed with patient. Referred patient to the [facility] Quitline and gave resources to free smoking cessation classes at the [facility].

## 2014-05-08 NOTE — Addendum Note (Signed)
Encounter addended by: Loletta Parish, RN on: 05/08/2014  1:14 PM<BR>     Documentation filed: Notes Section

## 2014-05-08 NOTE — Addendum Note (Signed)
Encounter addended by: Armond Hang, LPN on: 06/07/3460  1:94 PM<BR>     Documentation filed: [employer], Orders

## 2014-05-23 ENCOUNTER — Telehealth (HOSPITAL_COMMUNITY): Payer: Self-pay | Admitting: *Deleted

## 2014-05-23 NOTE — Telephone Encounter (Signed)
Telephoned patient at home # and advised patient that pathology on breast discharge came back benign. Patient voiced understanding.

## 2015-06-04 ENCOUNTER — Encounter: Payer: Self-pay | Admitting: Obstetrics and Gynecology

## 2017-08-04 DIAGNOSIS — M24131 Other articular cartilage disorders, right wrist: Secondary | ICD-10-CM | POA: Diagnosis not present

## 2017-08-04 DIAGNOSIS — G5621 Lesion of ulnar nerve, right upper limb: Secondary | ICD-10-CM | POA: Diagnosis not present

## 2017-08-04 DIAGNOSIS — Z4789 Encounter for other orthopedic aftercare: Secondary | ICD-10-CM | POA: Diagnosis not present

## 2017-08-04 DIAGNOSIS — G5601 Carpal tunnel syndrome, right upper limb: Secondary | ICD-10-CM | POA: Diagnosis not present

## 2017-08-04 DIAGNOSIS — M7711 Lateral epicondylitis, right elbow: Secondary | ICD-10-CM | POA: Diagnosis not present

## 2017-08-15 DIAGNOSIS — M542 Cervicalgia: Secondary | ICD-10-CM | POA: Diagnosis not present

## 2017-08-15 DIAGNOSIS — M5136 Other intervertebral disc degeneration, lumbar region: Secondary | ICD-10-CM | POA: Diagnosis not present

## 2017-10-30 DIAGNOSIS — M818 Other osteoporosis without current pathological fracture: Secondary | ICD-10-CM | POA: Diagnosis not present

## 2017-10-31 DIAGNOSIS — G8929 Other chronic pain: Secondary | ICD-10-CM | POA: Diagnosis not present

## 2017-10-31 DIAGNOSIS — M1711 Unilateral primary osteoarthritis, right knee: Secondary | ICD-10-CM | POA: Diagnosis not present

## 2017-11-17 DIAGNOSIS — M818 Other osteoporosis without current pathological fracture: Secondary | ICD-10-CM | POA: Diagnosis not present

## 2017-12-13 DIAGNOSIS — K121 Other forms of stomatitis: Secondary | ICD-10-CM | POA: Diagnosis not present

## 2017-12-13 DIAGNOSIS — E7800 Pure hypercholesterolemia, unspecified: Secondary | ICD-10-CM | POA: Diagnosis not present

## 2017-12-13 DIAGNOSIS — M150 Primary generalized (osteo)arthritis: Secondary | ICD-10-CM | POA: Diagnosis not present

## 2017-12-13 DIAGNOSIS — G8929 Other chronic pain: Secondary | ICD-10-CM | POA: Diagnosis not present

## 2017-12-13 DIAGNOSIS — F17210 Nicotine dependence, cigarettes, uncomplicated: Secondary | ICD-10-CM | POA: Diagnosis not present

## 2017-12-13 DIAGNOSIS — M810 Age-related osteoporosis without current pathological fracture: Secondary | ICD-10-CM | POA: Diagnosis not present

## 2017-12-13 DIAGNOSIS — Z23 Encounter for immunization: Secondary | ICD-10-CM | POA: Diagnosis not present

## 2018-01-04 DIAGNOSIS — M150 Primary generalized (osteo)arthritis: Secondary | ICD-10-CM | POA: Diagnosis not present

## 2018-01-04 DIAGNOSIS — M5136 Other intervertebral disc degeneration, lumbar region: Secondary | ICD-10-CM | POA: Diagnosis not present

## 2018-03-14 DIAGNOSIS — G8929 Other chronic pain: Secondary | ICD-10-CM | POA: Diagnosis not present

## 2018-03-14 DIAGNOSIS — M150 Primary generalized (osteo)arthritis: Secondary | ICD-10-CM | POA: Diagnosis not present

## 2018-03-14 DIAGNOSIS — Z0000 Encounter for general adult medical examination without abnormal findings: Secondary | ICD-10-CM | POA: Diagnosis not present

## 2018-03-14 DIAGNOSIS — M810 Age-related osteoporosis without current pathological fracture: Secondary | ICD-10-CM | POA: Diagnosis not present

## 2018-03-14 DIAGNOSIS — F321 Major depressive disorder, single episode, moderate: Secondary | ICD-10-CM | POA: Diagnosis not present

## 2018-03-14 DIAGNOSIS — F419 Anxiety disorder, unspecified: Secondary | ICD-10-CM | POA: Diagnosis not present

## 2018-03-14 DIAGNOSIS — E7800 Pure hypercholesterolemia, unspecified: Secondary | ICD-10-CM | POA: Diagnosis not present

## 2018-03-14 DIAGNOSIS — F17210 Nicotine dependence, cigarettes, uncomplicated: Secondary | ICD-10-CM | POA: Diagnosis not present

## 2018-03-14 DIAGNOSIS — M5030 Other cervical disc degeneration, unspecified cervical region: Secondary | ICD-10-CM | POA: Diagnosis not present

## 2018-04-02 DIAGNOSIS — G894 Chronic pain syndrome: Secondary | ICD-10-CM | POA: Diagnosis not present

## 2018-04-02 DIAGNOSIS — M5136 Other intervertebral disc degeneration, lumbar region: Secondary | ICD-10-CM | POA: Diagnosis not present

## 2018-04-02 DIAGNOSIS — M542 Cervicalgia: Secondary | ICD-10-CM | POA: Diagnosis not present

## 2018-05-21 DIAGNOSIS — M818 Other osteoporosis without current pathological fracture: Secondary | ICD-10-CM | POA: Diagnosis not present

## 2018-05-23 DIAGNOSIS — M818 Other osteoporosis without current pathological fracture: Secondary | ICD-10-CM | POA: Diagnosis not present

## 2018-07-02 DIAGNOSIS — M542 Cervicalgia: Secondary | ICD-10-CM | POA: Diagnosis not present

## 2018-07-02 DIAGNOSIS — M5136 Other intervertebral disc degeneration, lumbar region: Secondary | ICD-10-CM | POA: Diagnosis not present

## 2018-07-09 DIAGNOSIS — K5080 Crohn's disease of both small and large intestine without complications: Secondary | ICD-10-CM | POA: Diagnosis not present

## 2018-08-09 DIAGNOSIS — F321 Major depressive disorder, single episode, moderate: Secondary | ICD-10-CM | POA: Diagnosis not present

## 2018-08-09 DIAGNOSIS — B354 Tinea corporis: Secondary | ICD-10-CM | POA: Diagnosis not present

## 2018-08-14 DIAGNOSIS — Z1231 Encounter for screening mammogram for malignant neoplasm of breast: Secondary | ICD-10-CM | POA: Diagnosis not present

## 2018-10-01 DIAGNOSIS — M5136 Other intervertebral disc degeneration, lumbar region: Secondary | ICD-10-CM | POA: Diagnosis not present

## 2018-10-01 DIAGNOSIS — M542 Cervicalgia: Secondary | ICD-10-CM | POA: Diagnosis not present

## 2018-10-01 DIAGNOSIS — M8949 Other hypertrophic osteoarthropathy, multiple sites: Secondary | ICD-10-CM | POA: Diagnosis not present

## 2018-10-01 DIAGNOSIS — G894 Chronic pain syndrome: Secondary | ICD-10-CM | POA: Diagnosis not present

## 2018-10-30 DIAGNOSIS — Z23 Encounter for immunization: Secondary | ICD-10-CM | POA: Diagnosis not present

## 2018-11-09 DIAGNOSIS — M25531 Pain in right wrist: Secondary | ICD-10-CM | POA: Diagnosis not present

## 2018-11-12 DIAGNOSIS — M818 Other osteoporosis without current pathological fracture: Secondary | ICD-10-CM | POA: Diagnosis not present

## 2018-11-21 DIAGNOSIS — S9032XA Contusion of left foot, initial encounter: Secondary | ICD-10-CM | POA: Diagnosis not present

## 2018-11-21 DIAGNOSIS — M79672 Pain in left foot: Secondary | ICD-10-CM | POA: Diagnosis not present

## 2018-11-22 DIAGNOSIS — M818 Other osteoporosis without current pathological fracture: Secondary | ICD-10-CM | POA: Diagnosis not present

## 2018-11-27 DIAGNOSIS — M25531 Pain in right wrist: Secondary | ICD-10-CM | POA: Diagnosis not present

## 2018-11-27 DIAGNOSIS — Z9889 Other specified postprocedural states: Secondary | ICD-10-CM | POA: Diagnosis not present

## 2018-11-27 DIAGNOSIS — M1811 Unilateral primary osteoarthritis of first carpometacarpal joint, right hand: Secondary | ICD-10-CM | POA: Diagnosis not present

## 2018-11-27 DIAGNOSIS — M19031 Primary osteoarthritis, right wrist: Secondary | ICD-10-CM | POA: Diagnosis not present

## 2018-11-30 DIAGNOSIS — M25531 Pain in right wrist: Secondary | ICD-10-CM | POA: Diagnosis not present

## 2018-11-30 DIAGNOSIS — M79641 Pain in right hand: Secondary | ICD-10-CM | POA: Diagnosis not present

## 2018-12-12 DIAGNOSIS — S92325D Nondisplaced fracture of second metatarsal bone, left foot, subsequent encounter for fracture with routine healing: Secondary | ICD-10-CM | POA: Diagnosis not present

## 2018-12-12 DIAGNOSIS — S9032XD Contusion of left foot, subsequent encounter: Secondary | ICD-10-CM | POA: Diagnosis not present

## 2018-12-24 DIAGNOSIS — Z79899 Other long term (current) drug therapy: Secondary | ICD-10-CM | POA: Diagnosis not present

## 2018-12-24 DIAGNOSIS — M5136 Other intervertebral disc degeneration, lumbar region: Secondary | ICD-10-CM | POA: Diagnosis not present

## 2018-12-24 DIAGNOSIS — M8949 Other hypertrophic osteoarthropathy, multiple sites: Secondary | ICD-10-CM | POA: Diagnosis not present

## 2018-12-24 DIAGNOSIS — G894 Chronic pain syndrome: Secondary | ICD-10-CM | POA: Diagnosis not present

## 2019-01-01 DIAGNOSIS — K5080 Crohn's disease of both small and large intestine without complications: Secondary | ICD-10-CM | POA: Diagnosis not present

## 2019-01-01 DIAGNOSIS — K219 Gastro-esophageal reflux disease without esophagitis: Secondary | ICD-10-CM | POA: Diagnosis not present

## 2019-01-01 DIAGNOSIS — Z79899 Other long term (current) drug therapy: Secondary | ICD-10-CM | POA: Diagnosis not present

## 2019-01-09 DIAGNOSIS — M2012 Hallux valgus (acquired), left foot: Secondary | ICD-10-CM | POA: Diagnosis not present

## 2019-01-09 DIAGNOSIS — S92325D Nondisplaced fracture of second metatarsal bone, left foot, subsequent encounter for fracture with routine healing: Secondary | ICD-10-CM | POA: Diagnosis not present

## 2019-01-09 DIAGNOSIS — S9032XD Contusion of left foot, subsequent encounter: Secondary | ICD-10-CM | POA: Diagnosis not present

## 2019-01-11 DIAGNOSIS — M19031 Primary osteoarthritis, right wrist: Secondary | ICD-10-CM | POA: Diagnosis not present

## 2019-02-11 DIAGNOSIS — G5621 Lesion of ulnar nerve, right upper limb: Secondary | ICD-10-CM | POA: Diagnosis not present

## 2019-02-11 DIAGNOSIS — M19031 Primary osteoarthritis, right wrist: Secondary | ICD-10-CM | POA: Diagnosis not present

## 2019-03-25 DIAGNOSIS — G894 Chronic pain syndrome: Secondary | ICD-10-CM | POA: Diagnosis not present

## 2019-03-25 DIAGNOSIS — M5136 Other intervertebral disc degeneration, lumbar region: Secondary | ICD-10-CM | POA: Diagnosis not present

## 2019-03-25 DIAGNOSIS — M542 Cervicalgia: Secondary | ICD-10-CM | POA: Diagnosis not present

## 2019-03-25 DIAGNOSIS — M8949 Other hypertrophic osteoarthropathy, multiple sites: Secondary | ICD-10-CM | POA: Diagnosis not present

## 2019-05-22 DIAGNOSIS — M85852 Other specified disorders of bone density and structure, left thigh: Secondary | ICD-10-CM | POA: Diagnosis not present

## 2019-05-22 DIAGNOSIS — M818 Other osteoporosis without current pathological fracture: Secondary | ICD-10-CM | POA: Diagnosis not present

## 2019-05-22 DIAGNOSIS — Z780 Asymptomatic menopausal state: Secondary | ICD-10-CM | POA: Diagnosis not present

## 2019-06-11 DIAGNOSIS — M818 Other osteoporosis without current pathological fracture: Secondary | ICD-10-CM | POA: Diagnosis not present

## 2019-06-19 ENCOUNTER — Ambulatory Visit
Admission: RE | Admit: 2019-06-19 | Discharge: 2019-06-19 | Disposition: A | Discharge status: Home / Self Care | Payer: Medicare HMO | Source: Ambulatory Visit | Attending: Family Medicine | Admitting: Family Medicine

## 2019-06-19 ENCOUNTER — Other Ambulatory Visit: Payer: Self-pay | Admitting: Family Medicine

## 2019-06-19 DIAGNOSIS — K5010 Crohn's disease of large intestine without complications: Secondary | ICD-10-CM | POA: Diagnosis not present

## 2019-06-19 DIAGNOSIS — F419 Anxiety disorder, unspecified: Secondary | ICD-10-CM | POA: Diagnosis not present

## 2019-06-19 DIAGNOSIS — M5137 Other intervertebral disc degeneration, lumbosacral region: Secondary | ICD-10-CM | POA: Diagnosis not present

## 2019-06-19 DIAGNOSIS — R0602 Shortness of breath: Secondary | ICD-10-CM

## 2019-06-19 DIAGNOSIS — Z0000 Encounter for general adult medical examination without abnormal findings: Secondary | ICD-10-CM | POA: Diagnosis not present

## 2019-06-19 DIAGNOSIS — M5030 Other cervical disc degeneration, unspecified cervical region: Secondary | ICD-10-CM | POA: Diagnosis not present

## 2019-06-19 DIAGNOSIS — L247 Irritant contact dermatitis due to plants, except food: Secondary | ICD-10-CM | POA: Diagnosis not present

## 2019-06-19 DIAGNOSIS — E7800 Pure hypercholesterolemia, unspecified: Secondary | ICD-10-CM | POA: Diagnosis not present

## 2019-06-19 DIAGNOSIS — M810 Age-related osteoporosis without current pathological fracture: Secondary | ICD-10-CM | POA: Diagnosis not present

## 2019-06-19 DIAGNOSIS — M150 Primary generalized (osteo)arthritis: Secondary | ICD-10-CM | POA: Diagnosis not present

## 2019-06-19 DIAGNOSIS — R05 Cough: Secondary | ICD-10-CM | POA: Diagnosis not present

## 2019-06-19 DIAGNOSIS — K219 Gastro-esophageal reflux disease without esophagitis: Secondary | ICD-10-CM | POA: Diagnosis not present

## 2019-06-19 DIAGNOSIS — G8929 Other chronic pain: Secondary | ICD-10-CM | POA: Diagnosis not present

## 2019-07-02 DIAGNOSIS — M5416 Radiculopathy, lumbar region: Secondary | ICD-10-CM | POA: Diagnosis not present

## 2019-07-02 DIAGNOSIS — M545 Low back pain: Secondary | ICD-10-CM | POA: Diagnosis not present

## 2019-07-03 DIAGNOSIS — M818 Other osteoporosis without current pathological fracture: Secondary | ICD-10-CM | POA: Diagnosis not present

## 2019-07-09 ENCOUNTER — Ambulatory Visit (HOSPITAL_COMMUNITY)
Admission: RE | Admit: 2019-07-09 | Discharge: 2019-07-09 | Disposition: A | Discharge status: Home / Self Care | Payer: Medicare HMO | Attending: Orthopedic Surgery | Admitting: Orthopedic Surgery

## 2019-07-09 ENCOUNTER — Emergency Department (HOSPITAL_BASED_OUTPATIENT_CLINIC_OR_DEPARTMENT_OTHER): Payer: Medicare HMO | Admitting: Anesthesiology

## 2019-07-09 ENCOUNTER — Encounter (HOSPITAL_BASED_OUTPATIENT_CLINIC_OR_DEPARTMENT_OTHER): Admission: RE | Disposition: A | Discharge status: Home / Self Care | Payer: Self-pay | Source: Home / Self Care

## 2019-07-09 ENCOUNTER — Emergency Department (HOSPITAL_COMMUNITY): Payer: Medicare HMO

## 2019-07-09 ENCOUNTER — Encounter (HOSPITAL_COMMUNITY): Payer: Self-pay | Admitting: Emergency Medicine

## 2019-07-09 ENCOUNTER — Ambulatory Visit (HOSPITAL_BASED_OUTPATIENT_CLINIC_OR_DEPARTMENT_OTHER): Admit: 2019-07-09 | Payer: Medicare HMO | Admitting: Orthopedic Surgery

## 2019-07-09 ENCOUNTER — Other Ambulatory Visit: Payer: Self-pay

## 2019-07-09 DIAGNOSIS — L089 Local infection of the skin and subcutaneous tissue, unspecified: Secondary | ICD-10-CM | POA: Insufficient documentation

## 2019-07-09 DIAGNOSIS — Z791 Long term (current) use of non-steroidal anti-inflammatories (NSAID): Secondary | ICD-10-CM | POA: Diagnosis not present

## 2019-07-09 DIAGNOSIS — B1920 Unspecified viral hepatitis C without hepatic coma: Secondary | ICD-10-CM | POA: Insufficient documentation

## 2019-07-09 DIAGNOSIS — M1990 Unspecified osteoarthritis, unspecified site: Secondary | ICD-10-CM | POA: Insufficient documentation

## 2019-07-09 DIAGNOSIS — Z882 Allergy status to sulfonamides status: Secondary | ICD-10-CM | POA: Insufficient documentation

## 2019-07-09 DIAGNOSIS — M65142 Other infective (teno)synovitis, left hand: Secondary | ICD-10-CM | POA: Diagnosis not present

## 2019-07-09 DIAGNOSIS — F329 Major depressive disorder, single episode, unspecified: Secondary | ICD-10-CM | POA: Diagnosis not present

## 2019-07-09 DIAGNOSIS — E785 Hyperlipidemia, unspecified: Secondary | ICD-10-CM | POA: Insufficient documentation

## 2019-07-09 DIAGNOSIS — Z03818 Encounter for observation for suspected exposure to other biological agents ruled out: Secondary | ICD-10-CM | POA: Diagnosis not present

## 2019-07-09 DIAGNOSIS — F17210 Nicotine dependence, cigarettes, uncomplicated: Secondary | ICD-10-CM | POA: Insufficient documentation

## 2019-07-09 DIAGNOSIS — Z20822 Contact with and (suspected) exposure to covid-19: Secondary | ICD-10-CM | POA: Insufficient documentation

## 2019-07-09 DIAGNOSIS — Z881 Allergy status to other antibiotic agents status: Secondary | ICD-10-CM | POA: Insufficient documentation

## 2019-07-09 DIAGNOSIS — M810 Age-related osteoporosis without current pathological fracture: Secondary | ICD-10-CM | POA: Insufficient documentation

## 2019-07-09 DIAGNOSIS — G894 Chronic pain syndrome: Secondary | ICD-10-CM | POA: Diagnosis not present

## 2019-07-09 DIAGNOSIS — M659 Synovitis and tenosynovitis, unspecified: Secondary | ICD-10-CM

## 2019-07-09 DIAGNOSIS — I10 Essential (primary) hypertension: Secondary | ICD-10-CM | POA: Diagnosis not present

## 2019-07-09 DIAGNOSIS — M65842 Other synovitis and tenosynovitis, left hand: Secondary | ICD-10-CM | POA: Diagnosis not present

## 2019-07-09 DIAGNOSIS — Z79899 Other long term (current) drug therapy: Secondary | ICD-10-CM | POA: Insufficient documentation

## 2019-07-09 DIAGNOSIS — F418 Other specified anxiety disorders: Secondary | ICD-10-CM | POA: Diagnosis not present

## 2019-07-09 DIAGNOSIS — M79642 Pain in left hand: Secondary | ICD-10-CM | POA: Diagnosis not present

## 2019-07-09 HISTORY — PX: INCISION AND DRAINAGE: SHX5863

## 2019-07-09 LAB — SARS CORONAVIRUS 2 BY RT PCR (HOSPITAL ORDER, PERFORMED IN ~~LOC~~ HOSPITAL LAB): SARS Coronavirus 2: NEGATIVE

## 2019-07-09 SURGERY — INCISION AND DRAINAGE
Anesthesia: General | Site: Finger | Laterality: Left

## 2019-07-09 MED ORDER — FENTANYL CITRATE (PF) 100 MCG/2ML IJ SOLN
INTRAMUSCULAR | Status: DC | PRN
  Administered 2019-07-09 (×2): 25 ug via INTRAVENOUS

## 2019-07-09 MED ORDER — EPHEDRINE SULFATE 50 MG/ML IJ SOLN
INTRAMUSCULAR | Status: DC | PRN
Start: 2019-07-09 — End: 2019-07-09
  Administered 2019-07-09: 5 mg via INTRAVENOUS
  Administered 2019-07-09: 10 mg via INTRAVENOUS
  Administered 2019-07-09: 5 mg via INTRAVENOUS
  Administered 2019-07-09: 10 mg via INTRAVENOUS

## 2019-07-09 MED ORDER — FENTANYL CITRATE (PF) 100 MCG/2ML IJ SOLN
INTRAMUSCULAR | Status: AC
  Filled 2019-07-09: qty 2

## 2019-07-09 MED ORDER — VANCOMYCIN HCL IN DEXTROSE 1-5 GM/200ML-% IV SOLN
INTRAVENOUS | Status: AC
  Filled 2019-07-09: qty 200

## 2019-07-09 MED ORDER — PROPOFOL 500 MG/50ML IV EMUL
INTRAVENOUS | Status: AC
  Filled 2019-07-09: qty 50

## 2019-07-09 MED ORDER — FENTANYL CITRATE (PF) 100 MCG/2ML IJ SOLN: 25.0000 ug | INTRAMUSCULAR | Status: DC | PRN

## 2019-07-09 MED ORDER — PROMETHAZINE HCL 25 MG/ML IJ SOLN: 6.2500 mg | INTRAMUSCULAR | Status: DC | PRN

## 2019-07-09 MED ORDER — HYDROCODONE-ACETAMINOPHEN 5-325 MG PO TABS: ORAL_TABLET | ORAL | 0 refills | Status: DC

## 2019-07-09 MED ORDER — LACTATED RINGERS IV SOLN: INTRAVENOUS | Status: DC

## 2019-07-09 MED ORDER — CEFAZOLIN SODIUM-DEXTROSE 2-4 GM/100ML-% IV SOLN
INTRAVENOUS | Status: AC
  Filled 2019-07-09: qty 100

## 2019-07-09 MED ORDER — HYDROCODONE-ACETAMINOPHEN 5-325 MG PO TABS
1.0000 | ORAL_TABLET | Freq: Once | ORAL | Status: AC
  Administered 2019-07-09: 1 via ORAL
  Filled 2019-07-09: qty 1

## 2019-07-09 MED ORDER — BUPIVACAINE HCL (PF) 0.25 % IJ SOLN
INTRAMUSCULAR | Status: DC | PRN
  Administered 2019-07-09: 9 mL

## 2019-07-09 MED ORDER — LIDOCAINE 2% (20 MG/ML) 5 ML SYRINGE
INTRAMUSCULAR | Status: AC
  Filled 2019-07-09: qty 5

## 2019-07-09 MED ORDER — ONDANSETRON HCL 4 MG/2ML IJ SOLN
INTRAMUSCULAR | Status: DC | PRN
  Administered 2019-07-09: 4 mg via INTRAVENOUS

## 2019-07-09 MED ORDER — LIDOCAINE 2% (20 MG/ML) 5 ML SYRINGE
INTRAMUSCULAR | Status: DC | PRN
  Administered 2019-07-09: 60 mg via INTRAVENOUS

## 2019-07-09 MED ORDER — MIDAZOLAM HCL 2 MG/2ML IJ SOLN
INTRAMUSCULAR | Status: AC
  Filled 2019-07-09: qty 2

## 2019-07-09 MED ORDER — DEXAMETHASONE SODIUM PHOSPHATE 10 MG/ML IJ SOLN
INTRAMUSCULAR | Status: AC
  Filled 2019-07-09: qty 1

## 2019-07-09 MED ORDER — DEXAMETHASONE SODIUM PHOSPHATE 10 MG/ML IJ SOLN
INTRAMUSCULAR | Status: DC | PRN
  Administered 2019-07-09: 5 mg via INTRAVENOUS

## 2019-07-09 MED ORDER — ONDANSETRON HCL 4 MG/2ML IJ SOLN
INTRAMUSCULAR | Status: AC
  Filled 2019-07-09: qty 2

## 2019-07-09 MED ORDER — OXYCODONE HCL 5 MG/5ML PO SOLN: 5.0000 mg | Freq: Once | ORAL | Status: DC | PRN

## 2019-07-09 MED ORDER — OXYCODONE HCL 5 MG PO TABS: 5.0000 mg | ORAL_TABLET | Freq: Once | ORAL | Status: DC | PRN

## 2019-07-09 MED ORDER — MIDAZOLAM HCL 5 MG/5ML IJ SOLN
INTRAMUSCULAR | Status: DC | PRN
  Administered 2019-07-09: 2 mg via INTRAVENOUS

## 2019-07-09 MED ORDER — VANCOMYCIN HCL 1000 MG IV SOLR
INTRAVENOUS | Status: DC | PRN
  Administered 2019-07-09: 1000 mg via INTRAVENOUS

## 2019-07-09 MED ORDER — SULFAMETHOXAZOLE-TRIMETHOPRIM 800-160 MG PO TABS
1.0000 | ORAL_TABLET | Freq: Two times a day (BID) | ORAL | 0 refills | Status: DC
Start: 2019-07-09 — End: 2019-09-04

## 2019-07-09 MED ORDER — PROPOFOL 10 MG/ML IV BOLUS
INTRAVENOUS | Status: DC | PRN
  Administered 2019-07-09: 150 mg via INTRAVENOUS

## 2019-07-09 MED ORDER — ACETAMINOPHEN 500 MG PO TABS: 1000.0000 mg | ORAL_TABLET | Freq: Once | ORAL | Status: DC

## 2019-07-09 SURGICAL SUPPLY — 53 items
APL PRP STRL LF DISP 70% ISPRP (MISCELLANEOUS) ×1
BAG DECANTER FOR FLEXI CONT (MISCELLANEOUS) IMPLANT
BLADE MINI RND TIP GREEN BEAV (BLADE) IMPLANT
BLADE SURG 15 STRL LF DISP TIS (BLADE) ×2 IMPLANT
BLADE SURG 15 STRL SS (BLADE) ×6
BNDG CMPR 9X4 STRL LF SNTH (GAUZE/BANDAGES/DRESSINGS) ×1
BNDG COHESIVE 1X5 TAN STRL LF (GAUZE/BANDAGES/DRESSINGS) ×2 IMPLANT
BNDG ELASTIC 2X5.8 VLCR STR LF (GAUZE/BANDAGES/DRESSINGS) IMPLANT
BNDG ELASTIC 3X5.8 VLCR STR LF (GAUZE/BANDAGES/DRESSINGS) ×2 IMPLANT
BNDG ESMARK 4X9 LF (GAUZE/BANDAGES/DRESSINGS) ×2 IMPLANT
BNDG GAUZE 1X2.1 STRL (MISCELLANEOUS) IMPLANT
BNDG GAUZE ELAST 4 BULKY (GAUZE/BANDAGES/DRESSINGS) ×2 IMPLANT
CHLORAPREP W/TINT 26 (MISCELLANEOUS) ×3 IMPLANT
CORD BIPOLAR FORCEPS 12FT (ELECTRODE) ×3 IMPLANT
COVER BACK TABLE 60X90IN (DRAPES) ×3 IMPLANT
COVER MAYO STAND STRL (DRAPES) ×3 IMPLANT
COVER WAND RF STERILE (DRAPES) IMPLANT
CUFF TOURN SGL QUICK 18X4 (TOURNIQUET CUFF) ×3 IMPLANT
DRAPE EXTREMITY T 121X128X90 (DISPOSABLE) ×3 IMPLANT
DRAPE SURG 17X23 STRL (DRAPES) ×3 IMPLANT
GAUZE PACKING IODOFORM 1/4X15 (PACKING) ×2 IMPLANT
GAUZE SPONGE 4X4 12PLY STRL (GAUZE/BANDAGES/DRESSINGS) ×5 IMPLANT
GAUZE XEROFORM 1X8 LF (GAUZE/BANDAGES/DRESSINGS) ×3 IMPLANT
GLOVE BIO SURGEON STRL SZ7.5 (GLOVE) ×3 IMPLANT
GLOVE BIOGEL PI IND STRL 8 (GLOVE) ×1 IMPLANT
GLOVE BIOGEL PI INDICATOR 8 (GLOVE) ×2
GOWN STRL REUS W/ TWL LRG LVL3 (GOWN DISPOSABLE) ×1 IMPLANT
GOWN STRL REUS W/TWL LRG LVL3 (GOWN DISPOSABLE) ×6
GOWN STRL REUS W/TWL XL LVL3 (GOWN DISPOSABLE) ×3 IMPLANT
LOOP VESSEL MAXI BLUE (MISCELLANEOUS) IMPLANT
NDL BLUNT 17GA (NEEDLE) IMPLANT
NDL HYPO 25X1 1.5 SAFETY (NEEDLE) IMPLANT
NEEDLE BLUNT 17GA (NEEDLE) ×3 IMPLANT
NEEDLE HYPO 25X1 1.5 SAFETY (NEEDLE) ×3 IMPLANT
NS IRRIG 1000ML POUR BTL (IV SOLUTION) ×3 IMPLANT
PAD CAST 3X4 CTTN HI CHSV (CAST SUPPLIES) IMPLANT
PADDING CAST ABS 4INX4YD NS (CAST SUPPLIES)
PADDING CAST ABS COTTON 4X4 ST (CAST SUPPLIES) ×1 IMPLANT
PADDING CAST COTTON 3X4 STRL (CAST SUPPLIES) ×3
SET BASIN DAY SURGERY F.S. (CUSTOM PROCEDURE TRAY) ×3 IMPLANT
SPLINT PLASTER CAST XFAST 3X15 (CAST SUPPLIES) IMPLANT
SPLINT PLASTER XTRA FASTSET 3X (CAST SUPPLIES) ×20
STOCKINETTE 4X48 STRL (DRAPES) ×3 IMPLANT
SUT ETHILON 4 0 PS 2 18 (SUTURE) IMPLANT
SWAB COLLECTION DEVICE MRSA (MISCELLANEOUS) ×2 IMPLANT
SWAB CULTURE ESWAB REG 1ML (MISCELLANEOUS) ×2 IMPLANT
SYR 20ML LL LF (SYRINGE) ×2 IMPLANT
SYR BULB EAR ULCER 3OZ GRN STR (SYRINGE) ×3 IMPLANT
SYR CONTROL 10ML LL (SYRINGE) ×2 IMPLANT
SYR TOOMEY 50ML (SYRINGE) ×2 IMPLANT
TOWEL GREEN STERILE FF (TOWEL DISPOSABLE) ×6 IMPLANT
TUBE FEEDING ENTERAL 5FR 16IN (TUBING) ×2 IMPLANT
UNDERPAD 30X36 HEAVY ABSORB (UNDERPADS AND DIAPERS) ×3 IMPLANT

## 2019-07-09 NOTE — ED Triage Notes (Signed)
Patient arrived by self from home. Patient c/o LFT hand pain that started Sunday w/ swelling present to one finger.   Patient doesn't recall any trauma to hand.

## 2019-07-09 NOTE — Anesthesia Procedure Notes (Signed)
Procedure Name: LMA Insertion Date/Time: 07/09/2019 2:45 PM Performed by: Genelle Bal, CRNA Pre-anesthesia Checklist: Patient identified, Emergency Drugs available, Suction available and Patient being monitored Patient Re-evaluated:Patient Re-evaluated prior to induction Oxygen Delivery Method: Circle system utilized Preoxygenation: Pre-oxygenation with 100% oxygen Induction Type: IV induction Ventilation: Mask ventilation without difficulty LMA: LMA inserted LMA Size: 4.0 Number of attempts: 1 Airway Equipment and Method: Bite block Placement Confirmation: positive ETCO2 Tube secured with: Tape Dental Injury: Teeth and Oropharynx as per pre-operative assessment

## 2019-07-09 NOTE — ED Provider Notes (Signed)
[facility] DEPT Provider Note   CSN: [id number] Arrival date & time: 07/09/19  4098     History Chief Complaint  Patient presents with  . Hand Pain    Kendra [name] is a 59 y.o. female.  HPI 59 year old female with a history of anxiety presents to the ER with left finger and hand pain since Sunday.  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  She did not recall any injuries, bites to the hand.  She noted pain later on Sunday afternoon, with progressive swelling and pain now over her metacarpal joint.  Has been difficult to bend.  She has had no fevers or chills, abdominal pain, nausea, vomiting.  No history of gout.  No history of IVDU.  She states that she just finished a prednisone taper for her sciatica.  Has meloxicam but has not taking anything for pain.    Past Medical History:  Diagnosis Date  . Anxiety   . Bronchitis, chronic (HCC)    inhaler prescribed  . Depression   . Mental disorder   . Osteoporosis   . Shortness of breath    on exertion, smoker  . Wears glasses   . Wears partial dentures     Patient Active Problem List   Diagnosis Date Noted  . INTRAVENOUS DRUG ABUSE, HX OF 01/11/2006  . SYNDROME, CHRONIC PAIN 01/11/2006  . HEPATITIS C, HX OF 01/11/2006  . ANXIETY DISORDER, GENERALIZED 11/29/2005  . TOBACCO ABUSE 11/29/2005  . DEGENERATIVE JOINT DISEASE 11/29/2005  . WRIST PAIN, RIGHT 11/29/2005  . KNEE PAIN, BILATERAL 11/29/2005  . LOW BACK PAIN 11/29/2005  . ALCOHOL ABUSE, HX OF 11/29/2005  . ARTHROSCOPY, KNEE, HX OF 11/29/2005    Past Surgical History:  Procedure Laterality Date  . ABDOMINAL HYSTERECTOMY  09/13/2011   Procedure: HYSTERECTOMY ABDOMINAL;  Surgeon: Melina Schools, MD;  Location: [facility];  Service: Gynecology;  Laterality: N/A;  Total abdominal hysterectomy  . FINGER ARTHRODESIS Right 01/06/2014   Procedure: ARTHRODESIS FINGER;  Surgeon: Jolyn Nap,  MD;  Location: [facility];  Service: Orthopedics;  Laterality: Right;  . KNEE ARTHROSCOPY  1998   rt  . MOUTH SURGERY     teeth removed  . RHINOPLASTY  1980  . ULNAR NERVE TRANSPOSITION Right 01/06/2014   Procedure: RIGHT ULNA NEOPLASTY AT ELBOW AND THUMB METACARPAL PHALANGEAL JOINT FUSION;  Surgeon: Jolyn Nap, MD;  Location: [facility];  Service: Orthopedics;  Laterality: Right;  . WISDOM TOOTH EXTRACTION       OB History    Gravida  2   Para  2   Term  2   Preterm      AB      Living  2     SAB      TAB      Ectopic      Multiple      Live Births              Family History  Problem Relation Age of Onset  . Hypertension Mother   . Diabetes Mother   . Breast cancer Maternal Aunt   . Breast cancer Maternal Grandmother     Social History   Tobacco Use  . Smoking status: Current Every Day Smoker    Packs/day: 1.00    Types: Cigarettes  . Smokeless tobacco: Never Used  Substance Use Topics  . Alcohol use: No  .  Drug use: No    Home Medications Prior to Admission medications   Medication Sig Start Date End Date Taking? Authorizing Provider  atorvastatin (LIPITOR) 40 MG tablet Take 40 mg by mouth daily. 07/02/19  Yes [provider]  Calcium-Magnesium-Zinc 5026757501 MG TABS Take by mouth.   Yes [provider]  citalopram (CELEXA) 20 MG tablet Take 20 mg by mouth daily.   Yes [provider]  cyclobenzaprine (FLEXERIL) 10 MG tablet Take 10 mg by mouth at bedtime. 07/02/19  Yes [provider]  denosumab (PROLIA) 60 MG/ML SOSY injection Inject 60 mg into the skin every 6 (six) months. 07/03/19  Yes [provider]  famotidine (PEPCID) 20 MG tablet Take 20 mg by mouth daily. 07/02/19  Yes [provider]  gabapentin (NEURONTIN) 300 MG capsule Take 300 mg by mouth 3 (three) times daily.   Yes [provider]  meloxicam (MOBIC) 15 MG tablet Take 15 mg by mouth daily.  07/02/19  Yes [provider]  mesalamine (PENTASA) 500 MG CR capsule Take 1,000 mg by mouth in the morning, at noon, in the evening, and at bedtime. 10/24/18  Yes [provider]  methocarbamol (ROBAXIN) 500 MG tablet Take 100 mg by mouth 2 (two) times daily as needed for muscle pain. 07/02/19  Yes [provider]  Multiple Vitamins-Minerals (MULTIVITAMIN & MINERAL PO) Take by mouth.   Yes [provider]  traMADol (ULTRAM) 50 MG tablet Take by mouth every 6 (six) hours as needed.   Yes [provider]    Allergies    Sulfa antibiotics and Doxycycline  Review of Systems   Review of Systems  Constitutional: Negative for chills and fever.  Musculoskeletal: Positive for arthralgias and joint swelling.  Skin: Positive for color change.  All other systems reviewed and are negative.   Physical Exam Updated Vital Signs BP 122/78 (BP Location: Left Arm)   Pulse (!) 56   Temp 98.8 F (37.1 C) (Oral)   Resp 18   Ht 5\' 5"  (1.651 m)   Wt 65.8 kg   LMP 09/13/2011   SpO2 99%   BMI 24.13 kg/m   Physical Exam Vitals and nursing note reviewed.  Constitutional:      General: She is not in acute distress.    Appearance: Normal appearance. She is well-developed. She is not ill-appearing, toxic-appearing or diaphoretic.  HENT:     Head: Normocephalic and atraumatic.     Nose: Nose normal.     Mouth/Throat:     Mouth: Mucous membranes are moist.     Pharynx: Oropharynx is clear.  Eyes:     Extraocular Movements: Extraocular movements intact.     Conjunctiva/sclera: Conjunctivae normal.     Pupils: Pupils are equal, round, and reactive to light.  Cardiovascular:     Rate and Rhythm: Normal rate and regular rhythm.     Pulses: Normal pulses.     Heart sounds: Normal heart sounds. No murmur.  Pulmonary:     Effort: Pulmonary effort is normal. No respiratory distress.     Breath sounds: Normal breath sounds.  Abdominal:     General: Abdomen is  flat.     Palpations: Abdomen is soft.     Tenderness: There is no abdominal tenderness.  Musculoskeletal:        General: Swelling and tenderness present. No deformity or signs of injury.     Cervical back: Normal range of motion and neck supple.     Comments:  Left index finger with fusiform swelling with redness extending into the palm with diffuse tenderness to palpation over finger and MCP joint. Held in slightly flexed position, cannot fully extend, significant pain with flexion.  Mild overlying erythema and warmth to the touch.  No evidence of broken skin. 2+ radial pulse   Skin:    General: Skin is warm and dry.  Neurological:     General: No focal deficit present.     Mental Status: She is alert and oriented to person, place, and time.     Sensory: No sensory deficit.     Motor: No weakness.  Psychiatric:        Mood and Affect: Mood normal.        Behavior: Behavior normal.         ED Results / Procedures / Treatments   Labs (all labs ordered are listed, but only abnormal results are displayed) Labs Reviewed  SARS CORONAVIRUS 2 BY RT PCR (HOSPITAL ORDER, [facility])    EKG None  Radiology DG Hand Complete Left  Result Date: 07/09/2019 CLINICAL DATA:  Left hand pain, swelling at left index finger. No injury. EXAM: LEFT HAND - COMPLETE 3+ VIEW COMPARISON:  None. FINDINGS: Soft tissue swelling in the left index finger. No acute bony abnormality. Specifically, no fracture, subluxation, or dislocation. Degenerative changes at the 1st carpometacarpal joint. Otherwise joint spaces maintained. IMPRESSION: No acute bony abnormality. Electronically Signed   By: Rolm Baptise M.D.   On: 07/09/2019 08:45    Procedures Procedures (including critical care time)  Medications Ordered in ED Medications  HYDROcodone-acetaminophen (NORCO/VICODIN) 5-325 MG per tablet 1 tablet (1 tablet Oral Given 07/09/19 5993)    ED Course  I have reviewed the triage  vital signs and the nursing notes.  Pertinent labs & imaging results that were available during my care of the patient were reviewed by me and considered in my medical decision making (see chart for details).    MDM Rules/Calculators/A&P                     59 year old female with swelling to her left index finger since Sunday.  On presentation, she is alert and oriented, nontoxic-appearing, in no acute distress.  Physical exam with swelling to the left index finger, held in slightly flexed position, erythema and tenderness spreading into the palm.  Concern for flexor tensynovitis versus cellulitis.  X-ray without acute fractures or evidence of osteomyelitis.  Consulted Orion Crook, PA-C with hand surgery, he will come see and evaluate the patient.  Pain treated with Norco in the ER.  10:45 AM: Updated by Jeffrey's PA-C, patient will be taken to the OR for surgery on her flexor tenosynovitis.  She has remained hemodynamically stable in the ED.  Patient updated of her surgical status.  Unclear if she will be operated on at [facility] or transfer to [facility].  11:30 AM: Updated by Lurlean Horns, patient will be transferring to [facility] for surgery by personal vehicle.  Has remained hemodynamically stable throughout the course.  Final Clinical Impression(s) / ED Diagnoses Final diagnoses:  Flexor tenosynovitis of finger    Rx / DC Orders ED Discharge Orders    None       Lyndel Safe 07/09/19 1200    Lacretia Leigh, MD 07/15/19 [phone]

## 2019-07-09 NOTE — Discharge Instructions (Addendum)

## 2019-07-09 NOTE — Transfer of Care (Signed)
Immediate Anesthesia Transfer of Care Note  Patient: Kendra [name]  Procedure(s) Performed: INCISION AND DRAINAGE LEFT INDEX FINGER (Left Finger)  Patient Location: PACU  Anesthesia Type:General  Level of Consciousness: awake, alert  and oriented  Airway & Oxygen Therapy: Patient Spontanous Breathing and Patient connected to face mask oxygen  Post-op Assessment: Report given to RN, Post -op Vital signs reviewed and stable and Patient moving all extremities X 4  Post vital signs: Reviewed and stable  Last Vitals:  Vitals Value Taken Time  BP 125/77 07/09/19 1532  Temp 36.7 C 07/09/19 1530  Pulse 77 07/09/19 1537  Resp 20 07/09/19 1537  SpO2 100 % 07/09/19 1537  Vitals shown include unvalidated device data.  Last Pain:  Vitals:   07/09/19 1530  TempSrc:   PainSc: 0-No pain      Patients Stated Pain Goal: 5 (32/02/33 4356)  Complications: No apparent anesthesia complications

## 2019-07-09 NOTE — Consult Note (Addendum)
Reason for Consult:Left index finger pain Referring Physician: A Merryl [name] is an 59 y.o. female.  HPI: Kendra [name] comes in with Kendra 3d hx/o left index finger pain. It started on Sunday though she does not recall any trauma or wound. 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 The pain increased over the next couple of days and this morning she woke up and the pain and swelling were worse and had extended into her palm so she came to the ED. She is RHD and is on disability for neck/back issues. She denies fevers, chills, sweats, N/V, or prior instances of same.  Past Medical History:  Diagnosis Date   Anxiety    Bronchitis, chronic (HCC)    inhaler prescribed   Depression    Mental disorder    Osteoporosis    Shortness of breath    on exertion, smoker   Wears glasses    Wears partial dentures     Past Surgical History:  Procedure Laterality Date   ABDOMINAL HYSTERECTOMY  09/13/2011   Procedure: HYSTERECTOMY ABDOMINAL;  Surgeon: Melina Schools, MD;  Location: [facility];  Service: Gynecology;  Laterality: N/Kendra;  Total abdominal hysterectomy   FINGER ARTHRODESIS Right 01/06/2014   Procedure: ARTHRODESIS FINGER;  Surgeon: Jolyn Nap, MD;  Location: [facility];  Service: Orthopedics;  Laterality: Right;   KNEE ARTHROSCOPY  1998   rt   MOUTH SURGERY     teeth removed   RHINOPLASTY  1980   ULNAR NERVE TRANSPOSITION Right 01/06/2014   Procedure: RIGHT ULNA NEOPLASTY AT [place] PHALANGEAL JOINT FUSION;  Surgeon: Jolyn Nap, MD;  Location: [facility];  Service: Orthopedics;  Laterality: Right;   WISDOM TOOTH EXTRACTION      Family History  Problem Relation Age of Onset   Hypertension Mother    Diabetes Mother    Breast cancer Maternal Aunt    Breast cancer Maternal Grandmother     Social History:  reports that she has been smoking cigarettes. She has been smoking about 1.00 pack per day. She has never used smokeless  tobacco. She reports that she does not drink alcohol or use drugs.  Allergies:  Allergies  Allergen Reactions   Sulfa Antibiotics Other (See Comments)    migraines   Doxycycline Hives and Rash    Medications: I have reviewed the patient's current medications.  No results found for this or any previous visit (from the past 48 hour(s)).  DG Hand Complete Left  Result Date: 07/09/2019 CLINICAL DATA:  Left hand pain, swelling at left index finger. No injury. EXAM: LEFT HAND - COMPLETE 3+ VIEW COMPARISON:  None. FINDINGS: Soft tissue swelling in the left index finger. No acute bony abnormality. Specifically, no fracture, subluxation, or dislocation. Degenerative changes at the 1st carpometacarpal joint. Otherwise joint spaces maintained. IMPRESSION: No acute bony abnormality. Electronically Signed   By: Kendra [name] M.D.   On: 07/09/2019 08:45    Review of Systems  Constitutional: Negative for chills, diaphoresis and fever.  HENT: Negative for ear discharge, ear pain, hearing loss and tinnitus.   Eyes: Negative for photophobia and pain.  Respiratory: Negative for cough and shortness of breath.   Cardiovascular: Negative for chest pain.  Gastrointestinal: Negative for abdominal pain, nausea and vomiting.  Genitourinary: Negative for dysuria, flank pain, frequency and urgency.  Musculoskeletal: Positive for arthralgias (Left index finger/hand). Negative for back pain, myalgias and neck pain.  Neurological: Negative for  dizziness and headaches.  Hematological: Does not bruise/bleed easily.  Psychiatric/Behavioral: The patient is not nervous/anxious.    Blood pressure 118/83, pulse 60, temperature 98.8 F (37.1 C), temperature source Oral, resp. rate 16, height 5\' 5"  (1.651 m), weight 65.8 kg, last menstrual period 09/13/2011, SpO2 99 %. Physical Exam  Constitutional: She appears well-developed and well-nourished. No distress.  HENT:  Head: Normocephalic and atraumatic.  Eyes:  Conjunctivae are normal. Right eye exhibits no discharge. Left eye exhibits no discharge. No scleral icterus.  Cardiovascular: Normal rate and regular rhythm.  Respiratory: Effort normal. No respiratory distress.  Musculoskeletal:     Cervical back: Normal range of motion.     Comments: Left shoulder, elbow, wrist, digits- no skin wounds, index finger fusiform edema, mod pain with passive ext, held slightly flexed, pain in palm prox to index as well, no instability, no blocks to motion  Sens  Ax/R/M/U intact  Mot   Ax/ R/ PIN/ M/ AIN/ U intact  Rad 2+  Neurological: She is alert.  Skin: Skin is warm and dry. She is not diaphoretic.  Psychiatric: She has Kendra normal mood and affect. Her behavior is normal.    Assessment/Plan: Left index finger tenosynovitis -- Plan I&D later today at [facility] by Dr. Fredna Dow. Continue NPO. Medical problems including HLD, HTN, OA, and anxiety    Kendra Abu, PA-C Orthopedic Surgery [phone] 07/09/2019, 10:38 AM   Patient seen and examined.  Agree with above. 59 year old female with 3-day history of worsening swelling pain and erythema of the left index finger.  She was working in [place].  She does not remember specific injuries.  No fevers chills or night sweats. Exam: Intact sensation capillary refill in the fingertips bridging flex extend the IP joint of the thumb.  The left index finger is swollen.  She is tender along the course of the flexor tendon.  She has pain with passive extension of the finger.  Mild erythema volarly.  No proximal streaking. Radiographs: AP lateral oblique views left hand show no fracture dislocation or radiopaque foreign body. Kendra/P: Left index finger flexor sheath infection.  We discussed treatment options.  I recommend incision and drainage in the operating room.  Risks, benefits and alternatives of surgery were discussed including risks of blood loss, infection, damage to nerves/vessels/tendons/ligament/bone, failure  of surgery, need for additional surgery, complication with wound healing, stiffness, need for repeat irrigation and debridement.  She voiced understanding of these risks and elected to proceed.

## 2019-07-09 NOTE — Op Note (Signed)
NAME: Kendra [name] MEDICAL RECORD NO: [id number] DATE OF BIRTH: Dec 05, 1960 FACILITY: [facility] LOCATION: [facility] PHYSICIAN: Tennis Must, MD   OPERATIVE REPORT   DATE OF PROCEDURE: 07/09/19    PREOPERATIVE DIAGNOSIS:   Left index finger flexor sheath infection   POSTOPERATIVE DIAGNOSIS:   Left index finger flexor sheath infection   PROCEDURE:   Incision and drainage left index finger flexor sheath infection   SURGEON:  Leanora Cover, M.D.   ASSISTANT: none   ANESTHESIA:  General   INTRAVENOUS FLUIDS:  Per anesthesia flow sheet.   ESTIMATED BLOOD LOSS:  Minimal.   COMPLICATIONS:  None.   SPECIMENS:   Cultures to micro   TOURNIQUET TIME:    Total Tourniquet Time Documented: Upper Arm (Left) - 24 minutes Total: Upper Arm (Left) - 24 minutes    DISPOSITION:  Stable to PACU.   INDICATIONS: 59 year old female states she has had progressive swelling pain and erythema of the left index finger over the past few days.  She was working with potting soil over the weekend.  No fevers chills or night sweats.  She presented to the emergency department.  She has pain along the course of the flexor tendon of the left index finger and pain with passive extension of the finger.  There is erythema of the finger.  I recommended incision and drainage in the operating room. Risks, benefits and alternatives of surgery were discussed including the risks of blood loss, infection, damage to nerves, vessels, tendons, ligaments, bone for surgery, need for additional surgery, complications with wound healing, continued pain, stiffness, continued infection with need for repeat irrigation and debridement.  She voiced understanding of these risks and elected to proceed.  OPERATIVE COURSE:  After being identified preoperatively by myself,  the patient and I agreed on the procedure and site of the procedure.  The surgical site was marked.  Surgical consent had been signed. She was given  IV antibiotics as preoperative antibiotic prophylaxis. She was transferred to the operating room and placed on the operating table in supine position with the Left upper extremity on an arm board.  General anesthesia was induced by the anesthesiologist.  Left upper extremity was prepped and draped in normal sterile orthopedic fashion.  A surgical pause was performed between the surgeons, anesthesia, and operating room staff and all were in agreement as to the patient, procedure, and site of procedure.  Tourniquet at the proximal aspect of the extremity was inflated to 250 mmHg after exsanguination of the arm with an Esmarch bandage.    Incision was made at the volar aspect of the MP joint of the left index finger.  This is carried in subcutaneous tissues by spreading technique.  Bipolar electrocautery was used to obtain hemostasis.  The A1 pulley was sharply incised.  Within the flexor sheath and with milking of the finger there was dark cloudy fluid.  Cultures were taken for aerobes and anaerobes.  Incision was made over the volar aspect of the distal phalanx.  Again the sheath was opened.  Milking of the finger produced more of the thickened dark cloudy fluid.  #5 pediatric feeding tube was threaded into the flexor sheath from the proximal wound.  This was used to irrigate the sheath.  Good effluent was obtained from both proximal and distal wounds.  The feeding tube catheter was then placed into the sheath from the distal wound and again the sheath copiously irrigated with sterile saline.  In all approximately 250  to 300 cc of sterile saline was irrigated through the flexor sheath.  The wounds were then packed with quarter inch iodoform gauze.  A digital block was performed with quarter percent plain Marcaine to aid in postoperative analgesia.  The wounds were dressed with sterile Xeroform at the distal wound while leaving area open for drainage.  The wounds were dressed with sterile 4 x 4's and wrapped with a  Kerlix bandage.  A volar splint was placed and wrapped with Kerlix and Ace bandage.  The tourniquet was deflated at 24 minutes.  Fingertips were pink with brisk capillary refill after deflation of tourniquet.  The operative  drapes were broken down.  The patient was awoken from anesthesia safely.  She was transferred back to the stretcher and taken to PACU in stable condition.  I will see her back in the office in 3-4 days for postoperative followup.  I will give her a prescription for Norco 5/325 1-2 tabs PO q6 hours prn pain, dispense # 20 and Bactrim DS 1 p.o. twice daily x7 days.  She states she is taken Bactrim before without problems.   Leanora Cover, MD Electronically signed, 07/09/19

## 2019-07-09 NOTE — Anesthesia Preprocedure Evaluation (Signed)
Anesthesia Evaluation  Patient identified by MRN, date of birth, ID band Patient awake    Reviewed: Allergy & Precautions, NPO status , Patient's Chart, lab work & pertinent test results  Airway Mallampati: II  TM Distance: >3 FB Neck ROM: Full    Dental  (+) Dental Advisory Given   Pulmonary shortness of breath and with exertion, Current Smoker,    breath sounds clear to auscultation       Cardiovascular negative cardio ROS   Rhythm:Regular Rate:Normal     Neuro/Psych negative neurological ROS     GI/Hepatic negative GI ROS, Neg liver ROS,   Endo/Other  negative endocrine ROS  Renal/GU negative Renal ROS     Musculoskeletal  (+) Arthritis ,   Abdominal   Peds  Hematology negative hematology ROS (+)   Anesthesia Other Findings   Reproductive/Obstetrics                             Anesthesia Physical Anesthesia Plan  ASA: II  Anesthesia Plan: General   Post-op Pain Management:    Induction: Intravenous  PONV Risk Score and Plan: 2 and Dexamethasone, Ondansetron and Treatment may vary due to age or medical condition  Airway Management Planned: LMA  Additional Equipment: None  Intra-op Plan:   Post-operative Plan: Extubation in OR  Informed Consent: I have reviewed the patients History and Physical, chart, labs and discussed the procedure including the risks, benefits and alternatives for the proposed anesthesia with the patient or authorized representative who has indicated his/her understanding and acceptance.     Dental advisory given  Plan Discussed with: CRNA  Anesthesia Plan Comments:         Anesthesia Quick Evaluation

## 2019-07-10 ENCOUNTER — Encounter: Payer: Self-pay | Admitting: *Deleted

## 2019-07-10 LAB — ACID FAST SMEAR (AFB, MYCOBACTERIA): Acid Fast Smear: NEGATIVE

## 2019-07-10 NOTE — Anesthesia Postprocedure Evaluation (Signed)
Anesthesia Post Note  Patient: Kendra [name]  Procedure(s) Performed: INCISION AND DRAINAGE LEFT INDEX FINGER (Left Finger)     Patient location during evaluation: PACU Anesthesia Type: General Level of consciousness: awake and alert Pain management: pain level controlled Vital Signs Assessment: post-procedure vital signs reviewed and stable Respiratory status: spontaneous breathing, nonlabored ventilation, respiratory function stable and patient connected to nasal cannula oxygen Cardiovascular status: blood pressure returned to baseline and stable Postop Assessment: no apparent nausea or vomiting Anesthetic complications: no    Last Vitals:  Vitals:   07/09/19 1600 07/09/19 1610  BP:  (!) 151/84  Pulse:  80  Resp:  16  Temp:  36.8 C  SpO2: 100% 97%    Last Pain:  Vitals:   07/10/19 0959  TempSrc:   PainSc: 7    Pain Goal: Patients Stated Pain Goal: 5 (07/09/19 1353)                 Tiajuana Amass

## 2019-07-11 DIAGNOSIS — M79645 Pain in left finger(s): Secondary | ICD-10-CM | POA: Diagnosis not present

## 2019-07-11 DIAGNOSIS — M25642 Stiffness of left hand, not elsewhere classified: Secondary | ICD-10-CM | POA: Diagnosis not present

## 2019-07-11 DIAGNOSIS — M6510 Other infective (teno)synovitis, unspecified site: Secondary | ICD-10-CM | POA: Diagnosis not present

## 2019-07-11 LAB — FUNGUS CULTURE WITH STAIN

## 2019-07-12 DIAGNOSIS — M545 Low back pain: Secondary | ICD-10-CM | POA: Diagnosis not present

## 2019-07-14 LAB — AEROBIC/ANAEROBIC CULTURE W GRAM STAIN (SURGICAL/DEEP WOUND): Culture: NO GROWTH

## 2019-07-15 DIAGNOSIS — M25642 Stiffness of left hand, not elsewhere classified: Secondary | ICD-10-CM | POA: Diagnosis not present

## 2019-07-15 DIAGNOSIS — M79645 Pain in left finger(s): Secondary | ICD-10-CM | POA: Diagnosis not present

## 2019-07-15 DIAGNOSIS — M6510 Other infective (teno)synovitis, unspecified site: Secondary | ICD-10-CM | POA: Diagnosis not present

## 2019-07-16 DIAGNOSIS — M5416 Radiculopathy, lumbar region: Secondary | ICD-10-CM | POA: Diagnosis not present

## 2019-07-17 ENCOUNTER — Other Ambulatory Visit: Payer: Self-pay | Admitting: Orthopedic Surgery

## 2019-07-17 DIAGNOSIS — M5416 Radiculopathy, lumbar region: Secondary | ICD-10-CM

## 2019-07-18 DIAGNOSIS — M25642 Stiffness of left hand, not elsewhere classified: Secondary | ICD-10-CM | POA: Diagnosis not present

## 2019-07-18 DIAGNOSIS — M79645 Pain in left finger(s): Secondary | ICD-10-CM | POA: Diagnosis not present

## 2019-07-18 DIAGNOSIS — M6510 Other infective (teno)synovitis, unspecified site: Secondary | ICD-10-CM | POA: Diagnosis not present

## 2019-07-22 DIAGNOSIS — M25642 Stiffness of left hand, not elsewhere classified: Secondary | ICD-10-CM | POA: Diagnosis not present

## 2019-07-22 DIAGNOSIS — M79645 Pain in left finger(s): Secondary | ICD-10-CM | POA: Diagnosis not present

## 2019-07-22 DIAGNOSIS — M6510 Other infective (teno)synovitis, unspecified site: Secondary | ICD-10-CM | POA: Diagnosis not present

## 2019-07-24 ENCOUNTER — Other Ambulatory Visit: Payer: Self-pay

## 2019-07-24 ENCOUNTER — Ambulatory Visit
Admission: RE | Admit: 2019-07-24 | Discharge: 2019-07-24 | Disposition: A | Discharge status: Home / Self Care | Payer: Medicare HMO | Source: Ambulatory Visit | Attending: Orthopedic Surgery | Admitting: Orthopedic Surgery

## 2019-07-24 DIAGNOSIS — M5127 Other intervertebral disc displacement, lumbosacral region: Secondary | ICD-10-CM | POA: Diagnosis not present

## 2019-07-24 DIAGNOSIS — M5416 Radiculopathy, lumbar region: Secondary | ICD-10-CM

## 2019-07-24 MED ORDER — METHYLPREDNISOLONE ACETATE 40 MG/ML INJ SUSP (RADIOLOG
120.0000 mg | Freq: Once | INTRAMUSCULAR | Status: AC
  Administered 2019-07-24: 120 mg via EPIDURAL

## 2019-07-24 MED ORDER — IOPAMIDOL (ISOVUE-M 200) INJECTION 41%
1.0000 mL | Freq: Once | INTRAMUSCULAR | Status: AC
  Administered 2019-07-24: 1 mL via EPIDURAL

## 2019-07-24 NOTE — Discharge Instructions (Signed)

## 2019-07-25 DIAGNOSIS — M25642 Stiffness of left hand, not elsewhere classified: Secondary | ICD-10-CM | POA: Diagnosis not present

## 2019-07-25 DIAGNOSIS — M6510 Other infective (teno)synovitis, unspecified site: Secondary | ICD-10-CM | POA: Diagnosis not present

## 2019-07-25 DIAGNOSIS — M79645 Pain in left finger(s): Secondary | ICD-10-CM | POA: Diagnosis not present

## 2019-07-29 DIAGNOSIS — M79645 Pain in left finger(s): Secondary | ICD-10-CM | POA: Diagnosis not present

## 2019-07-29 DIAGNOSIS — M6510 Other infective (teno)synovitis, unspecified site: Secondary | ICD-10-CM | POA: Diagnosis not present

## 2019-07-29 DIAGNOSIS — M25642 Stiffness of left hand, not elsewhere classified: Secondary | ICD-10-CM | POA: Diagnosis not present

## 2019-08-02 DIAGNOSIS — M25642 Stiffness of left hand, not elsewhere classified: Secondary | ICD-10-CM | POA: Diagnosis not present

## 2019-08-02 DIAGNOSIS — M79645 Pain in left finger(s): Secondary | ICD-10-CM | POA: Diagnosis not present

## 2019-08-02 DIAGNOSIS — M6510 Other infective (teno)synovitis, unspecified site: Secondary | ICD-10-CM | POA: Diagnosis not present

## 2019-08-06 DIAGNOSIS — M5416 Radiculopathy, lumbar region: Secondary | ICD-10-CM | POA: Diagnosis not present

## 2019-08-07 ENCOUNTER — Other Ambulatory Visit: Payer: Self-pay | Admitting: Orthopedic Surgery

## 2019-08-16 DIAGNOSIS — Z1231 Encounter for screening mammogram for malignant neoplasm of breast: Secondary | ICD-10-CM | POA: Diagnosis not present

## 2019-08-22 LAB — ACID FAST CULTURE WITH REFLEXED SENSITIVITIES (MYCOBACTERIA): Acid Fast Culture: NEGATIVE

## 2019-08-27 NOTE — Progress Notes (Signed)
[place] ([address]), [place] - [address] [place] ([place]) [place] [address] Phone: [phone] Fax: [phone]      Your procedure is scheduled on Wednesday August 4th   Report to [facility] Main Entrance "A" at [place].M., and check in at the Admitting office.  Call this number if you have problems the morning of surgery:  [phone]  Call [phone] if you have any questions prior to your surgery date Monday-Friday 8am-4pm    Remember:  Do not eat after midnight the night before your surgery  You may drink clear liquids until 0940 am  the morning of your surgery.   Clear liquids allowed are: Water, Non-Citrus Juices (without pulp), Carbonated Beverages, Clear Tea, Black Coffee Only, and Gatorade   Enhanced Recovery after Surgery for Orthopedics Enhanced Recovery after Surgery is a protocol used to improve the stress on your body and your recovery after surgery.  Patient Instructions  . The night before surgery:  o No food after midnight. ONLY clear liquids after midnight  .  Marland Kitchen The day of surgery (if you do NOT have diabetes):  o Drink ONE (1) Pre-Surgery Clear Ensure by __0940am___ am the morning of surgery   o This drink was given to you during your hospital  pre-op appointment visit. o Nothing else to drink after completing the  Pre-Surgery Clear Ensure.         If you have questions, please contact your surgeon's office.     Take these medicines the morning of surgery with A SIP OF WATER  acetaminophen (TYLENOL) if needed citalopram (CELEXA) famotidine (PEPCID) gabapentin (NEURONTIN)  methocarbamol (ROBAXIN)  traMADol (ULTRAM)  If needed   As of today, STOP taking any Aspirin (unless otherwise instructed by your surgeon) Aleve, Naproxen, Ibuprofen, Motrin, Advil, Goody's, BC's, all herbal medications, fish oil, and all vitamins. meloxicam (MOBIC), mesalamine (PENTASA)                      Do not wear jewelry, make  up, or nail polish            Do not wear lotions, powders, perfumes, or deodorant.            Do not shave 48 hours prior to surgery.             Do not bring valuables to the hospital.            [facility] is not responsible for any belongings or valuables.  Do NOT Smoke (Tobacco/Vaping) or drink Alcohol 24 hours prior to your procedure If you use a CPAP at night, you may bring all equipment for your overnight stay.   Contacts, glasses, dentures or bridgework may not be worn into surgery.      For patients admitted to the hospital, discharge time will be determined by your treatment team.   Patients discharged the day of surgery will not be allowed to drive home, and someone needs to stay with them for 24 hours.    Special instructions:   [place]- Preparing For Surgery  Before surgery, you can play an important role. Because skin is not sterile, your skin needs to be as free of germs as possible. You can reduce the number of germs on your skin by washing with CHG (chlorahexidine gluconate) Soap before surgery.  CHG is an antiseptic cleaner which kills germs and bonds with the skin to continue killing germs even after  washing.    Oral Hygiene is also important to reduce your risk of infection.  Remember - BRUSH YOUR TEETH THE MORNING OF SURGERY WITH YOUR REGULAR TOOTHPASTE  Please do not use if you have an allergy to CHG or antibacterial soaps. If your skin becomes reddened/irritated stop using the CHG.  Do not shave (including legs and underarms) for at least 48 hours prior to first CHG shower. It is OK to shave your face.  Please follow these instructions carefully.   1. Shower the NIGHT BEFORE SURGERY and the MORNING OF SURGERY with CHG Soap.   2. If you chose to wash your hair, wash your hair first as usual with your normal shampoo.  3. After you shampoo, rinse your hair and body thoroughly to remove the shampoo.  4. Use CHG as you would any other liquid soap. You can  apply CHG directly to the skin and wash gently with a scrungie or a clean washcloth.   5. Apply the CHG Soap to your body ONLY FROM THE NECK DOWN.  Do not use on open wounds or open sores. Avoid contact with your eyes, ears, mouth and genitals (private parts). Wash Face and genitals (private parts)  with your normal soap.   6. Wash thoroughly, paying special attention to the area where your surgery will be performed.  7. Thoroughly rinse your body with warm water from the neck down.  8. DO NOT shower/wash with your normal soap after using and rinsing off the CHG Soap.  9. Pat yourself dry with a CLEAN TOWEL.  10. Wear CLEAN PAJAMAS to bed the night before surgery  11. Place CLEAN SHEETS on your bed the night of your first shower and DO NOT SLEEP WITH PETS.   Day of Surgery: Wear Clean/Comfortable clothing the morning of surgery Do not apply any deodorants/lotions.   Remember to brush your teeth WITH YOUR REGULAR TOOTHPASTE.   Please read over the following fact sheets that you were given.

## 2019-08-28 ENCOUNTER — Other Ambulatory Visit: Payer: Self-pay

## 2019-08-28 ENCOUNTER — Encounter (HOSPITAL_COMMUNITY): Payer: Self-pay

## 2019-08-28 ENCOUNTER — Encounter (HOSPITAL_COMMUNITY)
Admission: RE | Admit: 2019-08-28 | Discharge: 2019-08-28 | Disposition: A | Discharge status: Home / Self Care | Payer: Medicare HMO | Source: Ambulatory Visit | Attending: Orthopedic Surgery | Admitting: Orthopedic Surgery

## 2019-08-28 DIAGNOSIS — Z01812 Encounter for preprocedural laboratory examination: Secondary | ICD-10-CM | POA: Insufficient documentation

## 2019-08-28 HISTORY — DX: Pneumonia, unspecified organism: J18.9

## 2019-08-28 LAB — CBC WITH DIFFERENTIAL/PLATELET
Abs Immature Granulocytes: 0.04 10*3/uL (ref 0.00–0.07)
Basophils Absolute: 0.1 10*3/uL (ref 0.0–0.1)
Basophils Relative: 1 %
Eosinophils Absolute: 0.1 10*3/uL (ref 0.0–0.5)
Eosinophils Relative: 1 %
HCT: 46.4 % — ABNORMAL HIGH (ref 36.0–46.0)
Hemoglobin: 15 g/dL (ref 12.0–15.0)
Immature Granulocytes: 0 %
Lymphocytes Relative: 28 %
Lymphs Abs: 3.1 10*3/uL (ref 0.7–4.0)
MCH: 30.1 pg (ref 26.0–34.0)
MCHC: 32.3 g/dL (ref 30.0–36.0)
MCV: 93.2 fL (ref 80.0–100.0)
Monocytes Absolute: 0.7 10*3/uL (ref 0.1–1.0)
Monocytes Relative: 6 %
Neutro Abs: 7.2 10*3/uL (ref 1.7–7.7)
Neutrophils Relative %: 64 %
Platelets: 418 10*3/uL — ABNORMAL HIGH (ref 150–400)
RBC: 4.98 MIL/uL (ref 3.87–5.11)
RDW: 13.9 % (ref 11.5–15.5)
WBC: 11.2 10*3/uL — ABNORMAL HIGH (ref 4.0–10.5)
nRBC: 0 % (ref 0.0–0.2)

## 2019-08-28 LAB — URINALYSIS, ROUTINE W REFLEX MICROSCOPIC
Bilirubin Urine: NEGATIVE
Glucose, UA: NEGATIVE mg/dL
Hgb urine dipstick: NEGATIVE
Ketones, ur: 20 mg/dL — AB
Leukocytes,Ua: NEGATIVE
Nitrite: NEGATIVE
Protein, ur: NEGATIVE mg/dL
Specific Gravity, Urine: 1.011 (ref 1.005–1.030)
pH: 5 (ref 5.0–8.0)

## 2019-08-28 LAB — COMPREHENSIVE METABOLIC PANEL
ALT: 25 U/L (ref 0–44)
AST: 37 U/L (ref 15–41)
Albumin: 4.6 g/dL (ref 3.5–5.0)
Alkaline Phosphatase: 54 U/L (ref 38–126)
Anion gap: 13 (ref 5–15)
BUN: 16 mg/dL (ref 6–20)
CO2: 24 mmol/L (ref 22–32)
Calcium: 9.9 mg/dL (ref 8.9–10.3)
Chloride: 100 mmol/L (ref 98–111)
Creatinine, Ser: 0.58 mg/dL (ref 0.44–1.00)
GFR calc Af Amer: >60 mL/min (ref >60)
GFR calc non Af Amer: >60 mL/min (ref >60)
Glucose, Bld: 79 mg/dL (ref 70–99)
Potassium: 3.8 mmol/L (ref 3.5–5.1)
Sodium: 137 mmol/L (ref 135–145)
Total Bilirubin: 0.9 mg/dL (ref 0.3–1.2)
Total Protein: 7.7 g/dL (ref 6.5–8.1)

## 2019-08-28 LAB — TYPE AND SCREEN
ABO/RH(D): A POS
Antibody Screen: NEGATIVE

## 2019-08-28 LAB — APTT: aPTT: 31 seconds (ref 24–36)

## 2019-08-28 LAB — SURGICAL PCR SCREEN
MRSA, PCR: NEGATIVE
Staphylococcus aureus: POSITIVE — AB

## 2019-08-28 LAB — PROTIME-INR
INR: 0.9 (ref 0.8–1.2)
Prothrombin Time: 12.1 seconds (ref 11.4–15.2)

## 2019-08-28 NOTE — Progress Notes (Signed)
Abnormal labs have resulted. Dr. Lynann Bologna notified via [place].

## 2019-08-28 NOTE — Progress Notes (Signed)
PCP - Dr. Shirline Frees Cardiologist - denies  PPM/ICD - denies  Chest x-ray - N/A EKG - N/A Stress Test - denies ECHO - denies Cardiac Cath - denies  Sleep Study - denies CPAP - N/A  DM: denies  Blood Thinner Instructions: N/A Aspirin Instructions: N/A  ERAS Protcol - Yes PRE-SURGERY Ensure or G2- Ensure given  COVID TEST- Scheduled for 09/02/2019. Patient verbalized understanding of self-quarantine instructions, appointment time and place.  Anesthesia review: No  Patient denies shortness of breath, fever, cough and chest pain at PAT appointment  All instructions explained to the patient, with a verbal understanding of the material. Patient agrees to go over the instructions while at home for a better understanding. Patient also instructed to self quarantine after being tested for COVID-19. The opportunity to ask questions was provided.

## 2019-09-02 ENCOUNTER — Other Ambulatory Visit (HOSPITAL_COMMUNITY)
Admission: RE | Admit: 2019-09-02 | Discharge: 2019-09-02 | Disposition: A | Discharge status: Home / Self Care | Payer: Medicare HMO | Source: Ambulatory Visit | Attending: Orthopedic Surgery | Admitting: Orthopedic Surgery

## 2019-09-02 DIAGNOSIS — Z01812 Encounter for preprocedural laboratory examination: Secondary | ICD-10-CM | POA: Insufficient documentation

## 2019-09-02 DIAGNOSIS — Z20822 Contact with and (suspected) exposure to covid-19: Secondary | ICD-10-CM | POA: Insufficient documentation

## 2019-09-02 LAB — SARS CORONAVIRUS 2 (TAT 6-24 HRS): SARS Coronavirus 2: NEGATIVE

## 2019-09-04 ENCOUNTER — Ambulatory Visit (HOSPITAL_COMMUNITY): Payer: Medicare HMO | Admitting: Anesthesiology

## 2019-09-04 ENCOUNTER — Ambulatory Visit (HOSPITAL_COMMUNITY)
Admission: RE | Admit: 2019-09-04 | Discharge: 2019-09-04 | Disposition: A | Discharge status: Home / Self Care | Payer: Medicare HMO | Attending: Orthopedic Surgery | Admitting: Orthopedic Surgery

## 2019-09-04 ENCOUNTER — Encounter (HOSPITAL_COMMUNITY)
Admission: RE | Disposition: A | Discharge status: Home / Self Care | Payer: Self-pay | Source: Home / Self Care | Attending: Orthopedic Surgery

## 2019-09-04 ENCOUNTER — Other Ambulatory Visit: Payer: Self-pay

## 2019-09-04 ENCOUNTER — Ambulatory Visit (HOSPITAL_COMMUNITY): Payer: Medicare HMO

## 2019-09-04 DIAGNOSIS — M5117 Intervertebral disc disorders with radiculopathy, lumbosacral region: Secondary | ICD-10-CM | POA: Diagnosis not present

## 2019-09-04 DIAGNOSIS — M1990 Unspecified osteoarthritis, unspecified site: Secondary | ICD-10-CM | POA: Insufficient documentation

## 2019-09-04 DIAGNOSIS — Z791 Long term (current) use of non-steroidal anti-inflammatories (NSAID): Secondary | ICD-10-CM | POA: Diagnosis not present

## 2019-09-04 DIAGNOSIS — G9589 Other specified diseases of spinal cord: Secondary | ICD-10-CM | POA: Diagnosis not present

## 2019-09-04 DIAGNOSIS — M810 Age-related osteoporosis without current pathological fracture: Secondary | ICD-10-CM | POA: Insufficient documentation

## 2019-09-04 DIAGNOSIS — M5136 Other intervertebral disc degeneration, lumbar region: Secondary | ICD-10-CM | POA: Diagnosis not present

## 2019-09-04 DIAGNOSIS — F17210 Nicotine dependence, cigarettes, uncomplicated: Secondary | ICD-10-CM | POA: Diagnosis not present

## 2019-09-04 DIAGNOSIS — Z881 Allergy status to other antibiotic agents status: Secondary | ICD-10-CM | POA: Insufficient documentation

## 2019-09-04 DIAGNOSIS — J189 Pneumonia, unspecified organism: Secondary | ICD-10-CM | POA: Diagnosis not present

## 2019-09-04 DIAGNOSIS — F329 Major depressive disorder, single episode, unspecified: Secondary | ICD-10-CM | POA: Diagnosis not present

## 2019-09-04 DIAGNOSIS — M5127 Other intervertebral disc displacement, lumbosacral region: Secondary | ICD-10-CM | POA: Diagnosis not present

## 2019-09-04 DIAGNOSIS — M5418 Radiculopathy, sacral and sacrococcygeal region: Secondary | ICD-10-CM | POA: Diagnosis not present

## 2019-09-04 DIAGNOSIS — Z79899 Other long term (current) drug therapy: Secondary | ICD-10-CM | POA: Diagnosis not present

## 2019-09-04 DIAGNOSIS — Z419 Encounter for procedure for purposes other than remedying health state, unspecified: Secondary | ICD-10-CM

## 2019-09-04 DIAGNOSIS — F418 Other specified anxiety disorders: Secondary | ICD-10-CM | POA: Diagnosis not present

## 2019-09-04 HISTORY — PX: LUMBAR LAMINECTOMY/DECOMPRESSION MICRODISCECTOMY: SHX5026

## 2019-09-04 LAB — ABO/RH: ABO/RH(D): A POS

## 2019-09-04 SURGERY — LUMBAR LAMINECTOMY/DECOMPRESSION MICRODISCECTOMY
Anesthesia: General | Site: Back | Laterality: Left

## 2019-09-04 MED ORDER — GLYCOPYRROLATE 0.2 MG/ML IJ SOLN
INTRAMUSCULAR | Status: DC | PRN
  Administered 2019-09-04: .2 mg via INTRAVENOUS

## 2019-09-04 MED ORDER — DEXAMETHASONE SODIUM PHOSPHATE 10 MG/ML IJ SOLN
INTRAMUSCULAR | Status: AC
  Filled 2019-09-04: qty 1

## 2019-09-04 MED ORDER — ROCURONIUM BROMIDE 10 MG/ML (PF) SYRINGE
PREFILLED_SYRINGE | INTRAVENOUS | Status: AC
  Filled 2019-09-04: qty 10

## 2019-09-04 MED ORDER — PROMETHAZINE HCL 25 MG/ML IJ SOLN: 6.2500 mg | INTRAMUSCULAR | Status: DC | PRN

## 2019-09-04 MED ORDER — CEFAZOLIN SODIUM-DEXTROSE 2-4 GM/100ML-% IV SOLN
2.0000 g | INTRAVENOUS | Status: AC
  Administered 2019-09-04: 2 g via INTRAVENOUS

## 2019-09-04 MED ORDER — ONDANSETRON HCL 4 MG/2ML IJ SOLN
INTRAMUSCULAR | Status: AC
  Filled 2019-09-04: qty 2

## 2019-09-04 MED ORDER — FENTANYL CITRATE (PF) 250 MCG/5ML IJ SOLN
INTRAMUSCULAR | Status: DC | PRN
  Administered 2019-09-04: 50 ug via INTRAVENOUS

## 2019-09-04 MED ORDER — HYDROMORPHONE HCL 1 MG/ML IJ SOLN
INTRAMUSCULAR | Status: AC
  Filled 2019-09-04: qty 1

## 2019-09-04 MED ORDER — EPINEPHRINE PF 1 MG/ML IJ SOLN
INTRAMUSCULAR | Status: AC
  Filled 2019-09-04: qty 1

## 2019-09-04 MED ORDER — LACTATED RINGERS IV SOLN: INTRAVENOUS | Status: DC

## 2019-09-04 MED ORDER — METHYLENE BLUE 0.5 % INJ SOLN
INTRAVENOUS | Status: AC
  Filled 2019-09-04: qty 10

## 2019-09-04 MED ORDER — DEXAMETHASONE SODIUM PHOSPHATE 10 MG/ML IJ SOLN
INTRAMUSCULAR | Status: DC | PRN
  Administered 2019-09-04 (×2): 5 mg via INTRAVENOUS

## 2019-09-04 MED ORDER — ONDANSETRON HCL 4 MG/2ML IJ SOLN
INTRAMUSCULAR | Status: DC | PRN
  Administered 2019-09-04: 4 mg via INTRAVENOUS

## 2019-09-04 MED ORDER — PHENYLEPHRINE 40 MCG/ML (10ML) SYRINGE FOR IV PUSH (FOR BLOOD PRESSURE SUPPORT)
PREFILLED_SYRINGE | INTRAVENOUS | Status: AC
  Filled 2019-09-04: qty 10

## 2019-09-04 MED ORDER — OXYCODONE-ACETAMINOPHEN 5-325 MG PO TABS: 1.0000 | ORAL_TABLET | ORAL | 0 refills | Status: AC | PRN

## 2019-09-04 MED ORDER — BUPIVACAINE HCL (PF) 0.25 % IJ SOLN
INTRAMUSCULAR | Status: AC
  Filled 2019-09-04: qty 30

## 2019-09-04 MED ORDER — ROCURONIUM BROMIDE 10 MG/ML (PF) SYRINGE
PREFILLED_SYRINGE | INTRAVENOUS | Status: DC | PRN
  Administered 2019-09-04: 40 mg via INTRAVENOUS

## 2019-09-04 MED ORDER — LIDOCAINE 2% (20 MG/ML) 5 ML SYRINGE
INTRAMUSCULAR | Status: AC
  Filled 2019-09-04: qty 5

## 2019-09-04 MED ORDER — CEFAZOLIN SODIUM-DEXTROSE 2-4 GM/100ML-% IV SOLN
INTRAVENOUS | Status: AC
  Filled 2019-09-04: qty 100

## 2019-09-04 MED ORDER — EPHEDRINE SULFATE-NACL 50-0.9 MG/10ML-% IV SOSY
PREFILLED_SYRINGE | INTRAVENOUS | Status: DC | PRN
  Administered 2019-09-04 (×3): 5 mg via INTRAVENOUS
  Administered 2019-09-04: 15 mg via INTRAVENOUS

## 2019-09-04 MED ORDER — PROPOFOL 10 MG/ML IV BOLUS
INTRAVENOUS | Status: DC | PRN
  Administered 2019-09-04: 160 mg via INTRAVENOUS

## 2019-09-04 MED ORDER — METHYLPREDNISOLONE ACETATE 40 MG/ML IJ SUSP
INTRAMUSCULAR | Status: AC
  Filled 2019-09-04: qty 1

## 2019-09-04 MED ORDER — EPINEPHRINE PF 1 MG/ML IJ SOLN
INTRAMUSCULAR | Status: DC | PRN
  Administered 2019-09-04: .15 mL

## 2019-09-04 MED ORDER — THROMBIN 20000 UNITS EX SOLR
CUTANEOUS | Status: AC
  Filled 2019-09-04: qty 20000

## 2019-09-04 MED ORDER — THROMBIN 20000 UNITS EX SOLR
CUTANEOUS | Status: DC | PRN
  Administered 2019-09-04: 20 mL via TOPICAL

## 2019-09-04 MED ORDER — METHYLPREDNISOLONE ACETATE 40 MG/ML IJ SUSP
INTRAMUSCULAR | Status: DC | PRN
  Administered 2019-09-04: 40 mg

## 2019-09-04 MED ORDER — HYDROMORPHONE HCL 1 MG/ML IJ SOLN
0.2500 mg | INTRAMUSCULAR | Status: DC | PRN
  Administered 2019-09-04 (×2): 0.5 mg via INTRAVENOUS

## 2019-09-04 MED ORDER — PROPOFOL 10 MG/ML IV BOLUS
INTRAVENOUS | Status: AC
  Filled 2019-09-04: qty 20

## 2019-09-04 MED ORDER — SUGAMMADEX SODIUM 200 MG/2ML IV SOLN
INTRAVENOUS | Status: DC | PRN
  Administered 2019-09-04: 200 mg via INTRAVENOUS

## 2019-09-04 MED ORDER — METHYLENE BLUE 0.5 % INJ SOLN
INTRAVENOUS | Status: DC | PRN
  Administered 2019-09-04: 1 mL

## 2019-09-04 MED ORDER — 0.9 % SODIUM CHLORIDE (POUR BTL) OPTIME
TOPICAL | Status: DC | PRN
  Administered 2019-09-04: 1000 mL

## 2019-09-04 MED ORDER — CHLORHEXIDINE GLUCONATE 0.12 % MT SOLN: 15.0000 mL | Freq: Once | OROMUCOSAL | Status: AC

## 2019-09-04 MED ORDER — LIDOCAINE 2% (20 MG/ML) 5 ML SYRINGE
INTRAMUSCULAR | Status: DC | PRN
  Administered 2019-09-04: 40 mg via INTRAVENOUS
  Administered 2019-09-04: 60 mg via INTRAVENOUS

## 2019-09-04 MED ORDER — BUPIVACAINE HCL 0.25 % IJ SOLN
INTRAMUSCULAR | Status: DC | PRN
  Administered 2019-09-04: 7 mL
  Administered 2019-09-04: 23 mL

## 2019-09-04 MED ORDER — EPHEDRINE 5 MG/ML INJ
INTRAVENOUS | Status: AC
  Filled 2019-09-04: qty 10

## 2019-09-04 MED ORDER — BUPIVACAINE LIPOSOME 1.3 % IJ SUSP
20.0000 mL | Freq: Once | INTRAMUSCULAR | Status: AC
  Administered 2019-09-04: 20 mL
  Filled 2019-09-04: qty 20

## 2019-09-04 MED ORDER — FENTANYL CITRATE (PF) 100 MCG/2ML IJ SOLN
INTRAMUSCULAR | Status: AC
  Administered 2019-09-04: 50 ug via INTRAVENOUS
  Filled 2019-09-04: qty 2

## 2019-09-04 MED ORDER — CHLORHEXIDINE GLUCONATE 0.12 % MT SOLN
OROMUCOSAL | Status: AC
  Administered 2019-09-04: 15 mL via OROMUCOSAL
  Filled 2019-09-04: qty 15

## 2019-09-04 MED ORDER — METHOCARBAMOL 500 MG PO TABS: 500.0000 mg | ORAL_TABLET | Freq: Four times a day (QID) | ORAL | 0 refills | Status: AC | PRN

## 2019-09-04 MED ORDER — PHENYLEPHRINE 40 MCG/ML (10ML) SYRINGE FOR IV PUSH (FOR BLOOD PRESSURE SUPPORT)
PREFILLED_SYRINGE | INTRAVENOUS | Status: DC | PRN
  Administered 2019-09-04: 40 ug via INTRAVENOUS

## 2019-09-04 MED ORDER — POVIDONE-IODINE 7.5 % EX SOLN
Freq: Once | CUTANEOUS | Status: AC
  Filled 2019-09-04: qty 118

## 2019-09-04 MED ORDER — FENTANYL CITRATE (PF) 250 MCG/5ML IJ SOLN
INTRAMUSCULAR | Status: AC
  Filled 2019-09-04: qty 5

## 2019-09-04 MED ORDER — PHENYLEPHRINE HCL-NACL 10-0.9 MG/250ML-% IV SOLN
INTRAVENOUS | Status: DC | PRN
  Administered 2019-09-04: 40 ug/min via INTRAVENOUS

## 2019-09-04 MED ORDER — MIDAZOLAM HCL 5 MG/5ML IJ SOLN
INTRAMUSCULAR | Status: DC | PRN
  Administered 2019-09-04: 2 mg via INTRAVENOUS

## 2019-09-04 MED ORDER — FENTANYL CITRATE (PF) 100 MCG/2ML IJ SOLN: 50.0000 ug | Freq: Once | INTRAMUSCULAR | Status: AC

## 2019-09-04 MED ORDER — ORAL CARE MOUTH RINSE: 15.0000 mL | Freq: Once | OROMUCOSAL | Status: AC

## 2019-09-04 MED ORDER — MEPERIDINE HCL 25 MG/ML IJ SOLN: 6.2500 mg | INTRAMUSCULAR | Status: DC | PRN

## 2019-09-04 MED ORDER — MIDAZOLAM HCL 2 MG/2ML IJ SOLN
INTRAMUSCULAR | Status: AC
  Filled 2019-09-04: qty 2

## 2019-09-04 SURGICAL SUPPLY — 81 items
AGENT HMST KT MTR STRL THRMB (HEMOSTASIS)
APL SKNCLS STERI-STRIP NONHPOA (GAUZE/BANDAGES/DRESSINGS) ×1
BENZOIN TINCTURE PRP APPL 2/3 (GAUZE/BANDAGES/DRESSINGS) ×2 IMPLANT
BUR PRECISION FLUTE 5.0 (BURR) ×3 IMPLANT
BUR ROUND PRECISION 4.0 (BURR) ×1 IMPLANT
BUR ROUND PRECISION 4.0MM (BURR) ×1
CABLE BIPOLOR RESECTION CORD (MISCELLANEOUS) ×1 IMPLANT
CANISTER SUCT 3000ML PPV (MISCELLANEOUS) ×3 IMPLANT
CARTRIDGE OIL MAESTRO DRILL (MISCELLANEOUS) ×1 IMPLANT
CLOSURE WOUND 1/2 X4 (GAUZE/BANDAGES/DRESSINGS) ×1
COVER SURGICAL LIGHT HANDLE (MISCELLANEOUS) ×1 IMPLANT
COVER WAND RF STERILE (DRAPES) ×1 IMPLANT
DIFFUSER DRILL AIR PNEUMATIC (MISCELLANEOUS) ×3 IMPLANT
DRAIN CHANNEL 15F RND FF W/TCR (WOUND CARE) IMPLANT
DRAPE POUCH INSTRU U-SHP 10X18 (DRAPES) ×4 IMPLANT
DRAPE SURG 17X23 STRL (DRAPES) ×12 IMPLANT
DURAPREP 26ML APPLICATOR (WOUND CARE) ×3 IMPLANT
ELECT BLADE 4.0 EZ CLEAN MEGAD (MISCELLANEOUS) ×3
ELECT CAUTERY BLADE 6.4 (BLADE) ×3 IMPLANT
ELECT REM PT RETURN 9FT ADLT (ELECTROSURGICAL) ×3
ELECTRODE BLDE 4.0 EZ CLN MEGD (MISCELLANEOUS) ×1 IMPLANT
ELECTRODE REM PT RTRN 9FT ADLT (ELECTROSURGICAL) ×1 IMPLANT
EVACUATOR SILICONE 100CC (DRAIN) IMPLANT
FILTER STRAW FLUID ASPIR (MISCELLANEOUS) ×3 IMPLANT
GAUZE 4X4 16PLY RFD (DISPOSABLE) ×2 IMPLANT
GAUZE SPONGE 4X4 12PLY STRL (GAUZE/BANDAGES/DRESSINGS) ×3 IMPLANT
GLOVE BIO SURGEON STRL SZ7 (GLOVE) ×3 IMPLANT
GLOVE BIO SURGEON STRL SZ8 (GLOVE) ×3 IMPLANT
GLOVE BIOGEL PI IND STRL 7.0 (GLOVE) ×1 IMPLANT
GLOVE BIOGEL PI IND STRL 8 (GLOVE) ×1 IMPLANT
GLOVE BIOGEL PI INDICATOR 7.0 (GLOVE) ×2
GLOVE BIOGEL PI INDICATOR 8 (GLOVE) ×6
GLOVE SURG SS PI 7.5 STRL IVOR (GLOVE) ×12 IMPLANT
GOWN STRL REUS W/ TWL LRG LVL3 (GOWN DISPOSABLE) ×1 IMPLANT
GOWN STRL REUS W/ TWL XL LVL3 (GOWN DISPOSABLE) ×2 IMPLANT
GOWN STRL REUS W/TWL LRG LVL3 (GOWN DISPOSABLE) ×3
GOWN STRL REUS W/TWL XL LVL3 (GOWN DISPOSABLE) ×9
IV CATH 14GX2 1/4 (CATHETERS) ×3 IMPLANT
KIT BASIN OR (CUSTOM PROCEDURE TRAY) ×3 IMPLANT
KIT POSITION SURG JACKSON T1 (MISCELLANEOUS) ×3 IMPLANT
KIT TURNOVER KIT B (KITS) ×3 IMPLANT
NDL 18GX1X1/2 (RX/OR ONLY) (NEEDLE) ×1 IMPLANT
NDL HYPO 18GX1.5 BLUNT FILL (NEEDLE) IMPLANT
NDL HYPO 25GX1X1/2 BEV (NEEDLE) ×1 IMPLANT
NDL SPNL 18GX3.5 QUINCKE PK (NEEDLE) ×2 IMPLANT
NEEDLE 18GX1X1/2 (RX/OR ONLY) (NEEDLE) ×3 IMPLANT
NEEDLE 22X1 1/2 (OR ONLY) (NEEDLE) ×3 IMPLANT
NEEDLE HYPO 18GX1.5 BLUNT FILL (NEEDLE) ×3 IMPLANT
NEEDLE HYPO 25GX1X1/2 BEV (NEEDLE) ×3 IMPLANT
NEEDLE SPNL 18GX3.5 QUINCKE PK (NEEDLE) ×6 IMPLANT
NS IRRIG 1000ML POUR BTL (IV SOLUTION) ×3 IMPLANT
OIL CARTRIDGE MAESTRO DRILL (MISCELLANEOUS) ×3
PACK LAMINECTOMY ORTHO (CUSTOM PROCEDURE TRAY) ×3 IMPLANT
PACK UNIVERSAL I (CUSTOM PROCEDURE TRAY) ×3 IMPLANT
PAD ARMBOARD 7.5X6 YLW CONV (MISCELLANEOUS) ×6 IMPLANT
PATTIES SURGICAL .5 X.5 (GAUZE/BANDAGES/DRESSINGS) IMPLANT
PATTIES SURGICAL .5 X1 (DISPOSABLE) ×3 IMPLANT
SPONGE INTESTINAL PEANUT (DISPOSABLE) ×3 IMPLANT
SPONGE SURGIFOAM ABS GEL 100 (HEMOSTASIS) ×3 IMPLANT
SPONGE SURGIFOAM ABS GEL SZ50 (HEMOSTASIS) ×1 IMPLANT
STRIP CLOSURE SKIN 1/2X4 (GAUZE/BANDAGES/DRESSINGS) ×1 IMPLANT
SURGIFLO W/THROMBIN 8M KIT (HEMOSTASIS) IMPLANT
SUT MNCRL AB 4-0 PS2 18 (SUTURE) ×3 IMPLANT
SUT VIC AB 0 CT1 18XCR BRD 8 (SUTURE) IMPLANT
SUT VIC AB 0 CT1 27 (SUTURE)
SUT VIC AB 0 CT1 27XBRD ANBCTR (SUTURE) IMPLANT
SUT VIC AB 0 CT1 8-18 (SUTURE) ×3
SUT VIC AB 1 CT1 18XCR BRD 8 (SUTURE) ×1 IMPLANT
SUT VIC AB 1 CT1 8-18 (SUTURE) ×3
SUT VIC AB 2-0 CT2 18 VCP726D (SUTURE) ×3 IMPLANT
SYR 20ML LL LF (SYRINGE) ×3 IMPLANT
SYR 3ML LL SCALE MARK (SYRINGE) ×2 IMPLANT
SYR BULB IRRIG 60ML STRL (SYRINGE) ×1 IMPLANT
SYR CONTROL 10ML LL (SYRINGE) ×6 IMPLANT
SYR TB 1ML 25GX5/8 (SYRINGE) ×4 IMPLANT
SYR TB 1ML LUER SLIP (SYRINGE) ×2 IMPLANT
TAPE CLOTH SURG 4X10 WHT LF (GAUZE/BANDAGES/DRESSINGS) ×2 IMPLANT
TOWEL GREEN STERILE (TOWEL DISPOSABLE) ×3 IMPLANT
TOWEL GREEN STERILE FF (TOWEL DISPOSABLE) ×3 IMPLANT
WATER STERILE IRR 1000ML POUR (IV SOLUTION) ×3 IMPLANT
YANKAUER SUCT BULB TIP NO VENT (SUCTIONS) ×3 IMPLANT

## 2019-09-04 NOTE — Anesthesia Preprocedure Evaluation (Addendum)
Anesthesia Evaluation  Patient identified by MRN, date of birth, ID band Patient awake    Reviewed: Allergy & Precautions, NPO status , Patient's Chart, lab work & pertinent test results  Airway Mallampati: II  TM Distance: >3 FB Neck ROM: Full    Dental  (+) Missing, Dental Advisory Given,    Pulmonary shortness of breath and with exertion, Current Smoker,    Pulmonary exam normal breath sounds clear to auscultation       Cardiovascular negative cardio ROS Normal cardiovascular exam Rhythm:Regular Rate:Normal     Neuro/Psych PSYCHIATRIC DISORDERS Anxiety Depression negative neurological ROS     GI/Hepatic negative GI ROS, Neg liver ROS,   Endo/Other  negative endocrine ROS  Renal/GU negative Renal ROS     Musculoskeletal  (+) Arthritis ,   Abdominal   Peds  Hematology negative hematology ROS (+)   Anesthesia Other Findings   Reproductive/Obstetrics                            Anesthesia Physical  Anesthesia Plan  ASA: II  Anesthesia Plan: General   Post-op Pain Management:    Induction: Intravenous  PONV Risk Score and Plan: 2 and Dexamethasone, Ondansetron, Treatment may vary due to age or medical condition, Midazolam and Scopolamine patch - Pre-op  Airway Management Planned: Oral ETT  Additional Equipment: None  Intra-op Plan:   Post-operative Plan: Extubation in OR  Informed Consent: I have reviewed the patients History and Physical, chart, labs and discussed the procedure including the risks, benefits and alternatives for the proposed anesthesia with the patient or authorized representative who has indicated his/her understanding and acceptance.     Dental advisory given  Plan Discussed with: CRNA  Anesthesia Plan Comments:        Anesthesia Quick Evaluation

## 2019-09-04 NOTE — Op Note (Signed)
PATIENT NAME: Kendra [name]   MEDICAL RECORD NO.:   [id number]    DATE OF BIRTH: [birth year]   DATE OF PROCEDURE: 09/04/2019                               OPERATIVE REPORT     PREOPERATIVE DIAGNOSES: 1. Left-sided S1 radiculopathy. 2. Large left-sided L5-S1 disk herniation causing severe     compression of the left S1 nerve.   POSTOPERATIVE DIAGNOSES: 1. Left-sided S1 radiculopathy. 2. Large left-sided L5-S1 disk herniation causing severe     compression of the left S1 nerve.   PROCEDURES:  Left-sided L5-S1 laminotomy with partial facetectomy and removal of large herniated left-sided L5-S1 disk fragment.   SURGEON:  Phylliss Bob, MD.   ASSISTANTPricilla Holm, PA-C.   ANESTHESIA:  General endotracheal anesthesia.   COMPLICATIONS:  None.   DISPOSITION:  Stable.   ESTIMATED BLOOD LOSS:  Minimal.   INDICATIONS FOR SURGERY:  Briefly, [name] is a pleasant 59 year old female, who did present to me with severe pain in the left leg. The patient's MRI did reveal the findings outlined above, clearly notable for a large herniated disk fragment. We did discuss treatment options and we did ultimately elect to proceed with the procedure reflected above.  The patient was fully made aware of the risks of surgery, including the risk of recurrent herniation and the need for subsequent surgery, including the possibility of a subsequent diskectomy and/or fusion.   OPERATIVE DETAILS:  On 09/04/2019, the patient was brought to surgery and general endotracheal anesthesia was administered.  The patient was placed prone on a well-padded flat Jackson bed with a spinal frame.  Antibiotics were given.  The back was prepped and draped and a time-out procedure was performed.  At this point, a midline incision was made directly over the L5-S1 intervertebral space.  A curvilinear incision was made just to the left of the midline into the fascia.  A self-retaining McCulloch retractor was  placed.  The lamina of L5 and S1 was identified and subperiosteally exposed.  I then removed the lateral aspect of the L5-S1 ligamentum flavum.  Readily identified was the traversing left S1 nerve, which was noted to be under obvious tension, and was noted to be rather erythematous.  I was able to gently gain medial retraction of the nerve, and in doing so, a very large herniated disk fragment was readily noted.  This was removed in multiple large fragments.  This substantially lessened the tension on the nerve.  I then additionally explored the lateral recess, and with additional medial retraction of the nerve, additional fragments were uneventfully removed.   I was very pleased with the final decompression that I was able to accomplish.  At this point, the wound was copiously irrigated with normal saline.  All epidural bleeding was controlled using bipolar electrocautery.  All bleeding was controlled at the termination of the procedure.  At this point, 20 mg of Depo-Medrol was introduced about the epidural space in the region of the left S1 nerve.  The wound was then closed in layers using #1 Vicryl followed by 0 Vicryl, followed by 4-0 Monocryl. Benzoin and Steri-Strips were applied followed by a sterile dressing. All instrument counts were correct at the termination of the procedure.   Of note, Pricilla Holm was my assistant throughout surgery, and did aid in retraction, suctioning, and closure from start to finish.  Phylliss Bob, MD

## 2019-09-04 NOTE — Transfer of Care (Signed)
Immediate Anesthesia Transfer of Care Note  Patient: Kendra [name]  Procedure(s) Performed: LEFT-SIDED LUMBAR FIVE - SACRUM ONE MICRODISECTOMY (Left Back)  Patient Location: PACU  Anesthesia Type:General  Level of Consciousness: awake, alert  and oriented  Airway & Oxygen Therapy: Patient Spontanous Breathing and Patient connected to nasal cannula oxygen  Post-op Assessment: Report given to RN and Post -op Vital signs reviewed and stable  Post vital signs: Reviewed and stable  Last Vitals:  Vitals Value Taken Time  BP 127/78 09/04/19 1620  Temp 36.5 C 09/04/19 1620  Pulse 81 09/04/19 1629  Resp 12 09/04/19 1629  SpO2 100 % 09/04/19 1629  Vitals shown include unvalidated device data.  Last Pain:  Vitals:   09/04/19 1620  TempSrc:   PainSc: 6       Patients Stated Pain Goal: 3 (35/57/32 2025)  Complications: No complications documented.

## 2019-09-04 NOTE — H&P (Signed)
PREOPERATIVE H&P  Chief Complaint: Left leg pain   HPI: Kendra [name] is a 59 y.o. female who presents with ongoing pain in the left leg  MRI reveals a large left L5/S1 HNP, compressing the left S1 nerve  Patient has failed multiple forms of conservative care and continues to have pain (see office notes for additional details regarding the patient's full course of treatment)  Past Medical History:  Diagnosis Date  . Anxiety   . Bronchitis, chronic (HCC)    inhaler prescribed  . Depression   . Mental disorder   . Osteoporosis   . Pneumonia   . Shortness of breath    on exertion, smoker  . Wears glasses   . Wears partial dentures    Past Surgical History:  Procedure Laterality Date  . ABDOMINAL HYSTERECTOMY  09/13/2011   Procedure: HYSTERECTOMY ABDOMINAL;  Surgeon: Melina Schools, MD;  Location: [facility];  Service: Gynecology;  Laterality: N/A;  Total abdominal hysterectomy  . FINGER ARTHRODESIS Right 01/06/2014   Procedure: ARTHRODESIS FINGER;  Surgeon: Jolyn Nap, MD;  Location: [facility];  Service: Orthopedics;  Laterality: Right;  . INCISION AND DRAINAGE Left 07/09/2019   Procedure: INCISION AND DRAINAGE LEFT INDEX FINGER;  Surgeon: Leanora Cover, MD;  Location: [facility];  Service: Orthopedics;  Laterality: Left;  . KNEE ARTHROSCOPY  1998   rt  . MOUTH SURGERY     teeth removed  . RHINOPLASTY  1980  . ULNAR NERVE TRANSPOSITION Right 01/06/2014   Procedure: RIGHT ULNA NEOPLASTY AT ELBOW AND THUMB METACARPAL PHALANGEAL JOINT FUSION;  Surgeon: Jolyn Nap, MD;  Location: [facility];  Service: Orthopedics;  Laterality: Right;  . WISDOM TOOTH EXTRACTION     Social History   Socioeconomic History  . Marital status: Legally Separated    Spouse name: Not on file  . Number of children: Not on file  . Years of education: Not on file  . Highest education level: Not on file  Occupational History  . Not on file    Tobacco Use  . Smoking status: Current Every Day Smoker    Packs/day: 0.50    Types: Cigarettes  . Smokeless tobacco: Never Used  . Tobacco comment: 08/28/2019: about 0.5 - 0.75/pack day  Vaping Use  . Vaping Use: Never used  Substance and Sexual Activity  . Alcohol use: No  . Drug use: No  . Sexual activity: Not on file  Other Topics Concern  . Not on file  Social History Narrative  . Not on file   Social Determinants of Health   Financial Resource Strain:   . Difficulty of Paying Living Expenses:   Food Insecurity:   . Worried About Charity fundraiser in the Last Year:   . Arboriculturist in the Last Year:   Transportation Needs:   . Film/video editor (Medical):   Marland Kitchen Lack of Transportation (Non-Medical):   Physical Activity:   . Days of Exercise per Week:   . Minutes of Exercise per Session:   Stress:   . Feeling of Stress :   Social Connections:   . Frequency of Communication with Friends and Family:   . Frequency of Social Gatherings with Friends and Family:   . Attends Religious Services:   . Active Member of Clubs or Organizations:   . Attends Archivist Meetings:   Marland Kitchen Marital Status:    Family History  Problem  Relation Age of Onset  . Hypertension Mother   . Diabetes Mother   . Breast cancer Maternal Aunt   . Breast cancer Maternal Grandmother    Allergies  Allergen Reactions  . Doxycycline Hives and Rash   Prior to Admission medications   Medication Sig Start Date End Date Taking? Authorizing Provider  acetaminophen (TYLENOL) 500 MG tablet Take 1,000 mg by mouth every 6 (six) hours as needed (for pain.).   Yes [provider]  ascorbic acid (VITAMIN C) 500 MG tablet Take 500 mg by mouth daily.   Yes [provider]  atorvastatin (LIPITOR) 40 MG tablet Take 40 mg by mouth daily. 07/02/19  Yes [provider]  Calcium-Magnesium-Zinc (CAL-MAG-ZINC PO) Take 1 tablet by mouth daily.   Yes [provider]   citalopram (CELEXA) 20 MG tablet Take 30 mg by mouth daily.    Yes [provider]  cyclobenzaprine (FLEXERIL) 10 MG tablet Take 10 mg by mouth at bedtime. 07/02/19  Yes [provider]  famotidine (PEPCID) 20 MG tablet Take 40 mg by mouth daily.  07/02/19  Yes [provider]  gabapentin (NEURONTIN) 300 MG capsule Take 600 mg by mouth 3 (three) times daily.    Yes [provider]  meloxicam (MOBIC) 15 MG tablet Take 15 mg by mouth daily. 07/02/19  Yes [provider]  mesalamine (PENTASA) 500 MG CR capsule Take 1,000 mg by mouth in the morning, at noon, in the evening, and at bedtime. 10/24/18  Yes [provider]  methocarbamol (ROBAXIN) 500 MG tablet Take 1,000 mg by mouth 2 (two) times daily as needed (muscle spasms).  07/02/19  Yes [provider]  Multiple Vitamin (MULTIVITAMIN WITH MINERALS) TABS tablet Take 1 tablet by mouth daily.   Yes [provider]  traMADol (ULTRAM) 50 MG tablet Take 50-100 mg by mouth 2 (two) times daily as needed (pain.).  07/29/19  Yes [provider]  vitamin B-12 (CYANOCOBALAMIN) 1000 MCG tablet Take 1,000 mcg by mouth daily.   Yes [provider]  denosumab (PROLIA) 60 MG/ML SOSY injection Inject 60 mg into the skin every 6 (six) months. 07/03/19   [provider]  HYDROcodone-acetaminophen (NORCO) 5-325 MG tablet 1-2 tabs po q6 hours prn pain Patient not taking: Reported on 08/23/2019 07/09/19   Leanora Cover, MD  sulfamethoxazole-trimethoprim (BACTRIM DS) 800-160 MG tablet Take 1 tablet by mouth 2 (two) times daily. Patient not taking: Reported on 08/23/2019 07/09/19   Leanora Cover, MD     All other systems have been reviewed and were otherwise negative with the exception of those mentioned in the HPI and as above.  Physical Exam: Vitals:   09/04/19 0958  BP: 97/63  Pulse: 86  Resp: 20  Temp: 97.7 F (36.5 C)  SpO2: 96%    Body mass index is 20.19 kg/m.  General:  Alert, no acute distress Cardiovascular: No pedal edema Respiratory: No cyanosis, no use of accessory musculature Skin: No lesions in the area of chief complaint Neurologic: Sensation intact distally Psychiatric: Patient is competent for consent with normal mood and affect Lymphatic: No axillary or cervical lymphadenopathy  MUSCULOSKELETAL: + SLR on the left  Assessment/Plan: Left S1 RADICULOPATHY Plan for Procedure(s): LEFT-SIDED LUMBAR 5 - SACRUM 1 MICRODISECTOMY   Norva Karvonen, MD 09/04/2019 2:11 PM

## 2019-09-04 NOTE — Anesthesia Procedure Notes (Signed)
Procedure Name: Intubation Date/Time: 09/04/2019 2:42 PM Performed by: Trinna Post., CRNA Pre-anesthesia Checklist: Patient identified, Emergency Drugs available, Suction available, Patient being monitored and Timeout performed Patient Re-evaluated:Patient Re-evaluated prior to induction Oxygen Delivery Method: Circle system utilized Preoxygenation: Pre-oxygenation with 100% oxygen Induction Type: IV induction Ventilation: Mask ventilation without difficulty Laryngoscope Size: Mac and 3 Grade View: Grade I Tube type: Oral Tube size: 7.0 mm Airway Equipment and Method: Stylet Placement Confirmation: ETT inserted through vocal cords under direct vision,  positive ETCO2 and breath sounds checked- equal and bilateral Secured at: 22 cm Tube secured with: Tape Dental Injury: Teeth and Oropharynx as per pre-operative assessment

## 2019-09-05 ENCOUNTER — Encounter (HOSPITAL_COMMUNITY): Payer: Self-pay | Admitting: Orthopedic Surgery

## 2019-09-05 NOTE — Anesthesia Postprocedure Evaluation (Signed)
Anesthesia Post Note  Patient: Kendra [name]  Procedure(s) Performed: LEFT-SIDED LUMBAR FIVE - SACRUM ONE MICRODISECTOMY (Left Back)     Patient location during evaluation: PACU Anesthesia Type: General Level of consciousness: awake and alert Pain management: pain level controlled Vital Signs Assessment: post-procedure vital signs reviewed and stable Respiratory status: spontaneous breathing, nonlabored ventilation, respiratory function stable and patient connected to nasal cannula oxygen Cardiovascular status: blood pressure returned to baseline and stable Postop Assessment: no apparent nausea or vomiting Anesthetic complications: no   No complications documented.  Last Vitals:  Vitals:   09/04/19 1635 09/04/19 1655  BP: 111/60 129/75  Pulse: 78 75  Resp: 17 10  Temp:  36.7 C  SpO2: 97% 98%    Last Pain:  Vitals:   09/04/19 1655  TempSrc:   PainSc: 3                  Tiajuana Amass

## 2019-10-21 DIAGNOSIS — Z9889 Other specified postprocedural states: Secondary | ICD-10-CM | POA: Diagnosis not present

## 2019-10-21 DIAGNOSIS — M545 Low back pain: Secondary | ICD-10-CM | POA: Diagnosis not present

## 2019-10-23 ENCOUNTER — Other Ambulatory Visit: Payer: Medicare HMO

## 2019-10-23 DIAGNOSIS — Z20822 Contact with and (suspected) exposure to covid-19: Secondary | ICD-10-CM

## 2019-10-25 LAB — NOVEL CORONAVIRUS, NAA: SARS-CoV-2, NAA: NOT DETECTED

## 2019-10-25 LAB — SARS-COV-2, NAA 2 DAY TAT

## 2019-11-01 ENCOUNTER — Other Ambulatory Visit: Payer: Medicare HMO

## 2019-11-01 DIAGNOSIS — Z20822 Contact with and (suspected) exposure to covid-19: Secondary | ICD-10-CM

## 2019-11-02 LAB — SARS-COV-2, NAA 2 DAY TAT

## 2019-11-02 LAB — NOVEL CORONAVIRUS, NAA: SARS-CoV-2, NAA: DETECTED — AB

## 2019-11-03 ENCOUNTER — Telehealth: Payer: Self-pay | Admitting: Nurse Practitioner

## 2019-11-03 ENCOUNTER — Telehealth: Payer: Self-pay | Admitting: Physician Assistant

## 2019-11-03 NOTE — Telephone Encounter (Signed)
Called to Discuss with patient about Covid symptoms and the use of the monoclonal antibody infusion for those with mild to moderate Covid symptoms and at a high risk of hospitalization.     Pt appears to qualify for this infusion due to co-morbid conditions and/or a member of an at-risk group in accordance with the FDA Emergency Use Authorization.    Unable to reach pt. Left VM and sent MyChart.  Qualifies for SVI, UC.   Tami Lin Tunya Held, PA-C 11/03/2019, 8:22 AM

## 2019-11-03 NOTE — Telephone Encounter (Signed)
Called to discuss with Michaelyn Barter about Covid symptoms and the use of bamlanivimab/etesevimab or casirivimab/imdevimab, a monoclonal antibody infusion for those with mild to moderate Covid symptoms and at a high risk of hospitalization.  Pt does not qualify for infusion therapy as pt's symptoms first presented > 10 days prior to timing of infusion. Symptoms tier reviewed as well as criteria for ending isolation. Preventative practices reviewed. Patient verbalized understanding.  Provided with the [facility] phone number for any further questions, worsening symptoms, or complications   Patient Active Problem List   Diagnosis Date Noted  . INTRAVENOUS DRUG ABUSE, HX OF 01/11/2006  . SYNDROME, CHRONIC PAIN 01/11/2006  . HEPATITIS C, HX OF 01/11/2006  . ANXIETY DISORDER, GENERALIZED 11/29/2005  . TOBACCO ABUSE 11/29/2005  . DEGENERATIVE JOINT DISEASE 11/29/2005  . WRIST PAIN, RIGHT 11/29/2005  . KNEE PAIN, BILATERAL 11/29/2005  . LOW BACK PAIN 11/29/2005  . ALCOHOL ABUSE, HX OF 11/29/2005  . ARTHROSCOPY, KNEE, HX OF 11/29/2005    Walden Field, NP

## 2019-11-06 ENCOUNTER — Other Ambulatory Visit: Payer: Medicare HMO

## 2019-11-06 ENCOUNTER — Other Ambulatory Visit: Payer: Self-pay

## 2019-11-06 DIAGNOSIS — Z20822 Contact with and (suspected) exposure to covid-19: Secondary | ICD-10-CM | POA: Diagnosis not present

## 2019-11-08 ENCOUNTER — Other Ambulatory Visit: Payer: Medicare HMO

## 2019-11-08 DIAGNOSIS — Z20822 Contact with and (suspected) exposure to covid-19: Secondary | ICD-10-CM

## 2019-11-08 LAB — NOVEL CORONAVIRUS, NAA

## 2019-11-08 LAB — SARS-COV-2, NAA 2 DAY TAT

## 2019-11-10 LAB — NOVEL CORONAVIRUS, NAA: SARS-CoV-2, NAA: NOT DETECTED

## 2019-11-10 LAB — SARS-COV-2, NAA 2 DAY TAT

## 2019-11-26 DIAGNOSIS — G8929 Other chronic pain: Secondary | ICD-10-CM | POA: Diagnosis not present

## 2019-11-26 DIAGNOSIS — M810 Age-related osteoporosis without current pathological fracture: Secondary | ICD-10-CM | POA: Diagnosis not present

## 2019-11-26 DIAGNOSIS — K219 Gastro-esophageal reflux disease without esophagitis: Secondary | ICD-10-CM | POA: Diagnosis not present

## 2019-11-26 DIAGNOSIS — E7800 Pure hypercholesterolemia, unspecified: Secondary | ICD-10-CM | POA: Diagnosis not present

## 2019-11-26 DIAGNOSIS — M150 Primary generalized (osteo)arthritis: Secondary | ICD-10-CM | POA: Diagnosis not present

## 2019-11-26 DIAGNOSIS — F321 Major depressive disorder, single episode, moderate: Secondary | ICD-10-CM | POA: Diagnosis not present

## 2019-11-27 DIAGNOSIS — Z23 Encounter for immunization: Secondary | ICD-10-CM | POA: Diagnosis not present

## 2019-12-16 DIAGNOSIS — M818 Other osteoporosis without current pathological fracture: Secondary | ICD-10-CM | POA: Diagnosis not present

## 2019-12-24 DIAGNOSIS — F419 Anxiety disorder, unspecified: Secondary | ICD-10-CM | POA: Diagnosis not present

## 2019-12-24 DIAGNOSIS — F321 Major depressive disorder, single episode, moderate: Secondary | ICD-10-CM | POA: Diagnosis not present

## 2019-12-24 DIAGNOSIS — E7800 Pure hypercholesterolemia, unspecified: Secondary | ICD-10-CM | POA: Diagnosis not present

## 2019-12-24 DIAGNOSIS — G8929 Other chronic pain: Secondary | ICD-10-CM | POA: Diagnosis not present

## 2019-12-24 DIAGNOSIS — M150 Primary generalized (osteo)arthritis: Secondary | ICD-10-CM | POA: Diagnosis not present

## 2019-12-24 DIAGNOSIS — K5010 Crohn's disease of large intestine without complications: Secondary | ICD-10-CM | POA: Diagnosis not present

## 2020-01-02 DIAGNOSIS — K219 Gastro-esophageal reflux disease without esophagitis: Secondary | ICD-10-CM | POA: Diagnosis not present

## 2020-01-02 DIAGNOSIS — K5080 Crohn's disease of both small and large intestine without complications: Secondary | ICD-10-CM | POA: Diagnosis not present

## 2020-01-06 DIAGNOSIS — M810 Age-related osteoporosis without current pathological fracture: Secondary | ICD-10-CM | POA: Diagnosis not present

## 2020-02-03 DIAGNOSIS — M67911 Unspecified disorder of synovium and tendon, right shoulder: Secondary | ICD-10-CM | POA: Diagnosis not present

## 2020-02-03 DIAGNOSIS — M19011 Primary osteoarthritis, right shoulder: Secondary | ICD-10-CM | POA: Diagnosis not present

## 2020-02-03 DIAGNOSIS — M19012 Primary osteoarthritis, left shoulder: Secondary | ICD-10-CM | POA: Diagnosis not present

## 2020-02-03 DIAGNOSIS — M67912 Unspecified disorder of synovium and tendon, left shoulder: Secondary | ICD-10-CM | POA: Diagnosis not present

## 2020-02-05 ENCOUNTER — Other Ambulatory Visit: Payer: Self-pay

## 2020-02-05 ENCOUNTER — Emergency Department (HOSPITAL_COMMUNITY): Payer: Medicare HMO

## 2020-02-05 ENCOUNTER — Encounter (HOSPITAL_COMMUNITY): Payer: Self-pay

## 2020-02-05 ENCOUNTER — Emergency Department (HOSPITAL_COMMUNITY)
Admission: EM | Admit: 2020-02-05 | Discharge: 2020-02-05 | Disposition: A | Discharge status: Home / Self Care | Payer: Medicare HMO | Attending: Emergency Medicine | Admitting: Emergency Medicine

## 2020-02-05 DIAGNOSIS — Z20822 Contact with and (suspected) exposure to covid-19: Secondary | ICD-10-CM

## 2020-02-05 DIAGNOSIS — H6690 Otitis media, unspecified, unspecified ear: Secondary | ICD-10-CM | POA: Diagnosis not present

## 2020-02-05 DIAGNOSIS — R0981 Nasal congestion: Secondary | ICD-10-CM | POA: Diagnosis present

## 2020-02-05 DIAGNOSIS — H6693 Otitis media, unspecified, bilateral: Secondary | ICD-10-CM | POA: Diagnosis not present

## 2020-02-05 DIAGNOSIS — F17210 Nicotine dependence, cigarettes, uncomplicated: Secondary | ICD-10-CM | POA: Diagnosis not present

## 2020-02-05 DIAGNOSIS — R059 Cough, unspecified: Secondary | ICD-10-CM | POA: Diagnosis not present

## 2020-02-05 DIAGNOSIS — R5381 Other malaise: Secondary | ICD-10-CM | POA: Insufficient documentation

## 2020-02-05 DIAGNOSIS — R062 Wheezing: Secondary | ICD-10-CM

## 2020-02-05 MED ORDER — AMOXICILLIN 500 MG PO CAPS: 500.0000 mg | ORAL_CAPSULE | Freq: Two times a day (BID) | ORAL | 0 refills | Status: AC

## 2020-02-05 MED ORDER — ALBUTEROL SULFATE HFA 108 (90 BASE) MCG/ACT IN AERS
1.0000 | INHALATION_SPRAY | Freq: Four times a day (QID) | RESPIRATORY_TRACT | 0 refills | Status: AC | PRN
Start: 2020-02-05 — End: ?

## 2020-02-05 MED ORDER — PREDNISONE 50 MG PO TABS: 50.0000 mg | ORAL_TABLET | Freq: Every day | ORAL | 0 refills | Status: AC

## 2020-02-05 MED ORDER — BENZONATATE 100 MG PO CAPS: 100.0000 mg | ORAL_CAPSULE | Freq: Three times a day (TID) | ORAL | 0 refills | Status: AC

## 2020-02-05 MED ORDER — AMOXICILLIN 500 MG PO CAPS
500.0000 mg | ORAL_CAPSULE | Freq: Once | ORAL | Status: AC
  Administered 2020-02-05: 500 mg via ORAL
  Filled 2020-02-05: qty 1

## 2020-02-05 NOTE — Discharge Instructions (Addendum)
Take your medications as prescribed.  As discussed in the room your Covid results will be under your MyChart account.  Return for any worsening symptoms

## 2020-02-05 NOTE — ED Triage Notes (Signed)
Pt arrived via walk in, c/o nasal congestions, fevers, chills, ear fullness, cough, headache x2 days. Known sick exposures, negative for COVID

## 2020-02-05 NOTE — ED Provider Notes (Signed)
[facility]-EMERGENCY DEPT Provider Note   CSN: [id number] Arrival date & time: 02/05/20  1001     History COVID Symptoms   Kendra [name] is a 60 y.o. female with past medical history significant for chronic bronchitis, he uses rescue inhaler, tobacco user, remote history of IV drug use, no longer using who presents for concerns for Covid infection.  Patient states she has had nasal congestion, subjective fever, chills, ear fullness, worse on left, nonproductive cough, generalized malaise, intermittent headache and scratchy throat over the last 3 days.  States she is vaccinated for Covid.  She has had multiple Covid exposures.  Her daughter just tested negative for Covid however her son is positive.  She denies any sudden onset thunderclap headache.  No associated paresthesias, visual changes, neck pain, neck stiffness, difficulty with word finding, weakness.  Patient has no current headache.  Rhinorrhea clear in color.  No drainage from ears.  Denies chest pain, shortness of breath, neck pain, neck stiffness or abdominal pain, diarrhea, dysuria, lateral leg swelling, redness or warmth.  No history of PE or DVT.  Denies additional aggravating or alleviating factors.  History obtained from patient and past medical records.  No interpreter used  HPI     Past Medical History:  Diagnosis Date  . Anxiety   . Bronchitis, chronic (HCC)    inhaler prescribed  . Depression   . Mental disorder   . Osteoporosis   . Pneumonia   . Shortness of breath    on exertion, smoker  . Wears glasses   . Wears partial dentures     Patient Active Problem List   Diagnosis Date Noted  . INTRAVENOUS DRUG ABUSE, HX OF 01/11/2006  . SYNDROME, CHRONIC PAIN 01/11/2006  . HEPATITIS C, HX OF 01/11/2006  . ANXIETY DISORDER, GENERALIZED 11/29/2005  . TOBACCO ABUSE 11/29/2005  . DEGENERATIVE JOINT DISEASE 11/29/2005  . WRIST PAIN, RIGHT 11/29/2005  . KNEE PAIN, BILATERAL 11/29/2005  .  LOW BACK PAIN 11/29/2005  . ALCOHOL ABUSE, HX OF 11/29/2005  . ARTHROSCOPY, KNEE, HX OF 11/29/2005    Past Surgical History:  Procedure Laterality Date  . ABDOMINAL HYSTERECTOMY  09/13/2011   Procedure: HYSTERECTOMY ABDOMINAL;  Surgeon: Bing Plume, MD;  Location: [facility];  Service: Gynecology;  Laterality: N/A;  Total abdominal hysterectomy  . FINGER ARTHRODESIS Right 01/06/2014   Procedure: ARTHRODESIS FINGER;  Surgeon: Jodi Marble, MD;  Location: [facility];  Service: Orthopedics;  Laterality: Right;  . INCISION AND DRAINAGE Left 07/09/2019   Procedure: INCISION AND DRAINAGE LEFT INDEX FINGER;  Surgeon: Betha Loa, MD;  Location: [facility];  Service: Orthopedics;  Laterality: Left;  . KNEE ARTHROSCOPY  1998   rt  . LUMBAR LAMINECTOMY/DECOMPRESSION MICRODISCECTOMY Left 09/04/2019   Procedure: LEFT-SIDED LUMBAR FIVE - SACRUM ONE MICRODISECTOMY;  Surgeon: Estill Bamberg, MD;  Location: [facility];  Service: Orthopedics;  Laterality: Left;  . MOUTH SURGERY     teeth removed  . RHINOPLASTY  1980  . ULNAR NERVE TRANSPOSITION Right 01/06/2014   Procedure: RIGHT ULNA NEOPLASTY AT ELBOW AND THUMB METACARPAL PHALANGEAL JOINT FUSION;  Surgeon: Jodi Marble, MD;  Location:  SURGERY CENTER;  Service: Orthopedics;  Laterality: Right;  . WISDOM TOOTH EXTRACTION       OB History    Gravida  2   Para  2   Term  2   Preterm      AB      Living  2     SAB      IAB      Ectopic      Multiple      Live Births              Family History  Problem Relation Age of Onset  . Hypertension Mother   . Diabetes Mother   . Breast cancer Maternal Aunt   . Breast cancer Maternal Grandmother     Social History   Tobacco Use  . Smoking status: Current Every Day Smoker    Packs/day: 0.50    Types: Cigarettes  . Smokeless tobacco: Never Used  . Tobacco comment: 08/28/2019: about 0.5 - 0.75/pack day  Vaping Use  . Vaping Use: Never used   Substance Use Topics  . Alcohol use: No  . Drug use: No    Home Medications Prior to Admission medications   Medication Sig Start Date End Date Taking? Authorizing Provider  albuterol (VENTOLIN HFA) 108 (90 Base) MCG/ACT inhaler Inhale 1-2 puffs into the lungs every 6 (six) hours as needed for wheezing or shortness of breath. 02/05/20  Yes Camil Wilhelmsen A, PA-C  amoxicillin (AMOXIL) 500 MG capsule Take 1 capsule (500 mg total) by mouth 2 (two) times daily for 7 days. 02/05/20 02/12/20 Yes Vallarie Fei A, PA-C  benzonatate (TESSALON) 100 MG capsule Take 1 capsule (100 mg total) by mouth every 8 (eight) hours. 02/05/20  Yes Quy Lotts A, PA-C  predniSONE (DELTASONE) 50 MG tablet Take 1 tablet (50 mg total) by mouth daily for 5 days. 02/05/20 02/10/20 Yes Teirra Carapia A, PA-C  ascorbic acid (VITAMIN C) 500 MG tablet Take 500 mg by mouth daily.    [provider]  atorvastatin (LIPITOR) 40 MG tablet Take 40 mg by mouth daily. 07/02/19   [provider]  Calcium-Magnesium-Zinc (CAL-MAG-ZINC PO) Take 1 tablet by mouth daily.    [provider]  citalopram (CELEXA) 20 MG tablet Take 30 mg by mouth daily.     [provider]  cyclobenzaprine (FLEXERIL) 10 MG tablet Take 10 mg by mouth at bedtime. 07/02/19   [provider]  denosumab (PROLIA) 60 MG/ML SOSY injection Inject 60 mg into the skin every 6 (six) months. 07/03/19   [provider]  famotidine (PEPCID) 20 MG tablet Take 40 mg by mouth daily.  07/02/19   [provider]  gabapentin (NEURONTIN) 300 MG capsule Take 600 mg by mouth 3 (three) times daily.     [provider]  mesalamine (PENTASA) 500 MG CR capsule Take 1,000 mg by mouth in the morning, at noon, in the evening, and at bedtime. 10/24/18   [provider]  methocarbamol (ROBAXIN) 500 MG tablet Take 1,000 mg by mouth 2 (two) times daily as needed (muscle spasms).  07/02/19   [provider]   methocarbamol (ROBAXIN) 500 MG tablet Take 1 tablet (500 mg total) by mouth every 6 (six) hours as needed for muscle spasms. 09/04/19   McKenzie, Lennie Muckle, PA-C  Multiple Vitamin (MULTIVITAMIN WITH MINERALS) TABS tablet Take 1 tablet by mouth daily.    [provider]  vitamin B-12 (CYANOCOBALAMIN) 1000 MCG tablet Take 1,000 mcg by mouth daily.    [provider]    Allergies    Doxycycline  Review of Systems   Review of Systems  Constitutional: Positive for activity change, appetite change, chills, fatigue and fever (Subjective).  HENT: Positive for congestion, ear pain, rhinorrhea and sore throat (Scratchy). Negative for  dental problem, drooling, ear discharge, facial swelling, hearing loss, mouth sores, sinus pressure, sinus pain, sneezing, tinnitus, trouble swallowing and voice change.   Respiratory: Positive for cough. Negative for apnea, choking, chest tightness, shortness of breath, wheezing and stridor.   Cardiovascular: Negative.   Gastrointestinal: Negative.   Genitourinary: Negative.   Musculoskeletal: Positive for myalgias.  Skin: Negative.   Neurological: Positive for headaches (None currently). Negative for dizziness, seizures, syncope, facial asymmetry, speech difficulty, weakness, light-headedness and numbness.  All other systems reviewed and are negative.   Physical Exam Updated Vital Signs BP 122/74   Pulse 62   Temp 98 F (36.7 C) (Oral)   Resp 18   LMP 09/13/2011   SpO2 93%   Physical Exam Vitals and nursing note reviewed.  Constitutional:      General: She is not in acute distress.    Appearance: She is not ill-appearing, toxic-appearing or diaphoretic.  HENT:     Head: Normocephalic and atraumatic.     Jaw: There is normal jaw occlusion.     Right Ear: Ear canal and external ear normal. There is no impacted cerumen. No mastoid tenderness. No hemotympanum. Tympanic membrane is erythematous and bulging. Tympanic membrane is not injected,  scarred, perforated or retracted.     Left Ear: Tympanic membrane, ear canal and external ear normal. There is no impacted cerumen. No mastoid tenderness. No hemotympanum. Tympanic membrane is not injected, scarred, perforated, erythematous, retracted or bulging.     Ears:     Comments: No Mastoid tenderness.  Erythematous, bulging left TM with air-fluid level.    Nose: Congestion and rhinorrhea present.     Comments: Clear rhinorrhea and congestion to bilateral nares.  No sinus tenderness.    Mouth/Throat:     Comments: Posterior oropharynx clear.  Mucous membranes moist.  Tonsils without erythema or exudate.  Uvula midline without deviation.  No evidence of PTA or RPA.  No drooling, dysphasia or trismus.  Phonation normal. Eyes:     Extraocular Movements: Extraocular movements intact.     Conjunctiva/sclera: Conjunctivae normal.     Comments: No nystagmus  Neck:     Trachea: Trachea and phonation normal.     Meningeal: Brudzinski's sign and Kernig's sign absent.     Comments: No Neck stiffness or neck rigidity.  No meningismus.  No cervical lymphadenopathy. Cardiovascular:     Comments: No murmurs rubs or gallops. Pulmonary:     Effort: Pulmonary effort is normal. No tachypnea or accessory muscle usage.     Breath sounds: Wheezing present.     Comments: Expiratory wheeze bilaterally.  No accessory muscle usage.  Able speak in full sentences.  No respiratory distress Abdominal:     Comments: Soft, nontender without rebound or guarding.  No CVA tenderness.  Musculoskeletal:     Comments: Moves all 4 extremities without difficulty.  Lower extremities without edema, erythema or warmth.  Skin:    Comments: Brisk capillary refill.  No rashes or lesions.  Neurological:     General: No focal deficit present.     Mental Status: She is alert.     Cranial Nerves: Cranial nerves are intact.     Sensory: Sensation is intact.     Motor: Motor function is intact.     Coordination: Coordination  is intact.     Gait: Gait is intact.     Comments: Mental Status:  Alert, oriented, thought content appropriate. Speech fluent without evidence of aphasia. Able to follow 2 step commands  without difficulty.  Cranial Nerves:  II:  Peripheral visual fields grossly normal, pupils equal, round, reactive to light III,IV, VI: ptosis not present, extra-ocular motions intact bilaterally  V,VII: smile symmetric, facial light touch sensation equal VIII: hearing grossly normal bilaterally  IX,X: midline uvula rise  XI: bilateral shoulder shrug equal and strong XII: midline tongue extension  Motor:  5/5 in upper and lower extremities bilaterally including strong and equal grip strength and dorsiflexion/plantar flexion Sensory: Pinprick and light touch normal in all extremities.  Deep Tendon Reflexes: 2+ and symmetric  Cerebellar: normal finger-to-nose with bilateral upper extremities Gait: normal gait and balance CV: distal pulses palpable throughout        ED Results / Procedures / Treatments   Labs (all labs ordered are listed, but only abnormal results are displayed) Labs Reviewed  SARS CORONAVIRUS 2 (TAT 6-24 HRS)    EKG None  Radiology DG Chest Portable 1 View  Result Date: 02/05/2020 CLINICAL DATA:  Cough, congestion EXAM: PORTABLE CHEST 1 VIEW COMPARISON:  06/19/2019 FINDINGS: The heart size and mediastinal contours are within normal limits. Hyperexpanded lungs. No focal airspace consolidation, pleural effusion, or pneumothorax. The visualized skeletal structures are unremarkable. IMPRESSION: No acute cardiopulmonary findings. Electronically Signed   By: Davina Poke D.O.   On: 02/05/2020 16:34    Procedures Procedures (including critical care time)  Medications Ordered in ED Medications  amoxicillin (AMOXIL) capsule 500 mg (500 mg Oral Given 02/05/20 1554)    ED Course  I have reviewed the triage vital signs and the nursing notes.  Pertinent labs & imaging results that  were available during my care of the patient were reviewed by me and considered in my medical decision making (see chart for details).   60 year old, vaccinated for Covid however multiple exposures presents for evaluation of requesting Covid test.  He is afebrile, nonseptic, non-ill-appearing.  Patient with upper respiratory complaints, headache, myalgias.  No sudden onset thunderclap headache.  No current HA. She has a nonfocal neuro exam without deficits.  No associated paresthesias, weakness, difficulty with word finding, visual changes. No neck stiffness or neck rigidity.  She has no meningitis.  Right ear no evidence of otitis however left ear with bulging, erythematous, air-fluid level consistent with left TM consistent with otitis media.  Does have diffuse wheezing bilaterally.  History of chronic bronchitis and is tobacco user.  She is in no acute respiratory distress.  She denies any shortness of breath.  No clinical evidence of DVT on exam.  Her abdomen is soft, nontender.  Patient be given antibiotics for acute otitis media.  Will have outpatient Covid test.  She does on MyChart.  She will follow up on results for this.  We will also give steroids for reasons she has albuterol inhaler at home.  I have discussed return precautions.  She was understands agreeable for follow-up.  I have low suspicion for acute  Presentation for HA non concerning for , CVA, dissection, SAH, ICH, Meningitis, or temporal arteritis. Pt is afebrile with no focal neuro deficits, nuchal rigidity, or change in vision.  DG chest without significant findings.  Question bronchitis, COPD exacerbation as cause of her wheeze.  No acute respiratory distress low suspicion for PE, ACS, bacterial infectious source or dissection.  The patient has been appropriately medically screened and/or stabilized in the ED. I have low suspicion for any other emergent medical condition which would require further screening, evaluation or  treatment in the ED or require inpatient management.  Patient  is hemodynamically stable and in no acute distress.  Patient able to ambulate in department prior to ED.  Evaluation does not show acute pathology that would require ongoing or additional emergent interventions while in the emergency department or further inpatient treatment.  I have discussed the diagnosis with the patient and answered all questions.  Pain is been managed while in the emergency department and patient has no further complaints prior to discharge.  Patient is comfortable with plan discussed in room and is stable for discharge at this time.  I have discussed strict return precautions for returning to the emergency department.  Patient was encouraged to follow-up with PCP/specialist refer to at discharge.         MDM Rules/Calculators/A&P                          MAHNOOR FITZNER was evaluated in Emergency Department on 02/05/2020 for the symptoms described in the history of present illness. She was evaluated in the context of the global COVID-19 pandemic, which necessitated consideration that the patient might be at risk for infection with the SARS-CoV-2 virus that causes COVID-19. Institutional protocols and algorithms that pertain to the evaluation of patients at risk for COVID-19 are in a state of rapid change based on information released by regulatory bodies including the CDC and federal and state organizations. These policies and algorithms were followed during the patient's care in the ED. Final Clinical Impression(s) / ED Diagnoses Final diagnoses:  Acute otitis media, unspecified otitis media type  Person under investigation for COVID-19  Wheeze    Rx / DC Orders ED Discharge Orders         Ordered    amoxicillin (AMOXIL) 500 MG capsule  2 times daily        02/05/20 1609    benzonatate (TESSALON) 100 MG capsule  Every 8 hours        02/05/20 1609    predniSONE (DELTASONE) 50 MG tablet  Daily         02/05/20 1609    albuterol (VENTOLIN HFA) 108 (90 Base) MCG/ACT inhaler  Every 6 hours PRN        02/05/20 1609           Lynetta Tomczak A, PA-C 02/05/20 1734    Drenda Freeze, MD 02/05/20 2209

## 2020-02-05 NOTE — ED Notes (Signed)
Pt verbalized dc instructions and follow up care. Alert and ambulatory. No iv. Vitals stable prior to dc  

## 2020-02-06 LAB — SARS CORONAVIRUS 2 (TAT 6-24 HRS): SARS Coronavirus 2: NEGATIVE

## 2020-02-25 DIAGNOSIS — M25511 Pain in right shoulder: Secondary | ICD-10-CM | POA: Diagnosis not present

## 2020-02-25 DIAGNOSIS — M25512 Pain in left shoulder: Secondary | ICD-10-CM | POA: Diagnosis not present

## 2020-03-03 DIAGNOSIS — M25512 Pain in left shoulder: Secondary | ICD-10-CM | POA: Diagnosis not present

## 2020-03-03 DIAGNOSIS — M25511 Pain in right shoulder: Secondary | ICD-10-CM | POA: Diagnosis not present

## 2020-03-20 DIAGNOSIS — M19012 Primary osteoarthritis, left shoulder: Secondary | ICD-10-CM | POA: Diagnosis not present

## 2020-03-20 DIAGNOSIS — M19011 Primary osteoarthritis, right shoulder: Secondary | ICD-10-CM | POA: Diagnosis not present

## 2020-03-26 DIAGNOSIS — M150 Primary generalized (osteo)arthritis: Secondary | ICD-10-CM | POA: Diagnosis not present

## 2020-03-26 DIAGNOSIS — G4700 Insomnia, unspecified: Secondary | ICD-10-CM | POA: Diagnosis not present

## 2020-03-26 DIAGNOSIS — F321 Major depressive disorder, single episode, moderate: Secondary | ICD-10-CM | POA: Diagnosis not present

## 2020-03-26 DIAGNOSIS — G8929 Other chronic pain: Secondary | ICD-10-CM | POA: Diagnosis not present

## 2020-03-26 DIAGNOSIS — K219 Gastro-esophageal reflux disease without esophagitis: Secondary | ICD-10-CM | POA: Diagnosis not present

## 2020-03-26 DIAGNOSIS — M810 Age-related osteoporosis without current pathological fracture: Secondary | ICD-10-CM | POA: Diagnosis not present

## 2020-03-26 DIAGNOSIS — E7800 Pure hypercholesterolemia, unspecified: Secondary | ICD-10-CM | POA: Diagnosis not present

## 2020-04-14 DIAGNOSIS — S43431A Superior glenoid labrum lesion of right shoulder, initial encounter: Secondary | ICD-10-CM | POA: Diagnosis not present

## 2020-04-14 DIAGNOSIS — S43431S Superior glenoid labrum lesion of right shoulder, sequela: Secondary | ICD-10-CM | POA: Diagnosis not present

## 2020-04-14 DIAGNOSIS — G8918 Other acute postprocedural pain: Secondary | ICD-10-CM | POA: Diagnosis not present

## 2020-04-14 DIAGNOSIS — M7541 Impingement syndrome of right shoulder: Secondary | ICD-10-CM | POA: Diagnosis not present

## 2020-04-14 DIAGNOSIS — M75111 Incomplete rotator cuff tear or rupture of right shoulder, not specified as traumatic: Secondary | ICD-10-CM | POA: Diagnosis not present

## 2020-04-14 DIAGNOSIS — M94211 Chondromalacia, right shoulder: Secondary | ICD-10-CM | POA: Diagnosis not present

## 2020-06-12 DIAGNOSIS — M545 Low back pain, unspecified: Secondary | ICD-10-CM | POA: Diagnosis not present

## 2020-06-15 DIAGNOSIS — F419 Anxiety disorder, unspecified: Secondary | ICD-10-CM | POA: Diagnosis not present

## 2020-06-15 DIAGNOSIS — K5010 Crohn's disease of large intestine without complications: Secondary | ICD-10-CM | POA: Diagnosis not present

## 2020-06-15 DIAGNOSIS — M150 Primary generalized (osteo)arthritis: Secondary | ICD-10-CM | POA: Diagnosis not present

## 2020-06-15 DIAGNOSIS — M818 Other osteoporosis without current pathological fracture: Secondary | ICD-10-CM | POA: Diagnosis not present

## 2020-06-15 DIAGNOSIS — F17210 Nicotine dependence, cigarettes, uncomplicated: Secondary | ICD-10-CM | POA: Diagnosis not present

## 2020-06-15 DIAGNOSIS — F321 Major depressive disorder, single episode, moderate: Secondary | ICD-10-CM | POA: Diagnosis not present

## 2020-06-15 DIAGNOSIS — M549 Dorsalgia, unspecified: Secondary | ICD-10-CM | POA: Diagnosis not present

## 2020-06-15 DIAGNOSIS — E7800 Pure hypercholesterolemia, unspecified: Secondary | ICD-10-CM | POA: Diagnosis not present

## 2020-06-18 DIAGNOSIS — M545 Low back pain, unspecified: Secondary | ICD-10-CM | POA: Diagnosis not present

## 2020-06-30 DIAGNOSIS — M545 Low back pain, unspecified: Secondary | ICD-10-CM | POA: Diagnosis not present

## 2020-07-07 DIAGNOSIS — M47816 Spondylosis without myelopathy or radiculopathy, lumbar region: Secondary | ICD-10-CM | POA: Diagnosis not present

## 2020-07-07 DIAGNOSIS — F17210 Nicotine dependence, cigarettes, uncomplicated: Secondary | ICD-10-CM | POA: Diagnosis not present

## 2020-07-10 DIAGNOSIS — M810 Age-related osteoporosis without current pathological fracture: Secondary | ICD-10-CM | POA: Diagnosis not present

## 2020-07-15 DIAGNOSIS — M7541 Impingement syndrome of right shoulder: Secondary | ICD-10-CM | POA: Diagnosis not present

## 2020-07-15 DIAGNOSIS — M75111 Incomplete rotator cuff tear or rupture of right shoulder, not specified as traumatic: Secondary | ICD-10-CM | POA: Diagnosis not present

## 2020-07-15 DIAGNOSIS — Z9889 Other specified postprocedural states: Secondary | ICD-10-CM | POA: Diagnosis not present

## 2020-07-22 DIAGNOSIS — M47816 Spondylosis without myelopathy or radiculopathy, lumbar region: Secondary | ICD-10-CM | POA: Diagnosis not present

## 2020-07-29 DIAGNOSIS — G4700 Insomnia, unspecified: Secondary | ICD-10-CM | POA: Diagnosis not present

## 2020-07-29 DIAGNOSIS — E7800 Pure hypercholesterolemia, unspecified: Secondary | ICD-10-CM | POA: Diagnosis not present

## 2020-07-29 DIAGNOSIS — M150 Primary generalized (osteo)arthritis: Secondary | ICD-10-CM | POA: Diagnosis not present

## 2020-07-29 DIAGNOSIS — M810 Age-related osteoporosis without current pathological fracture: Secondary | ICD-10-CM | POA: Diagnosis not present

## 2020-07-29 DIAGNOSIS — F321 Major depressive disorder, single episode, moderate: Secondary | ICD-10-CM | POA: Diagnosis not present

## 2020-07-29 DIAGNOSIS — G8929 Other chronic pain: Secondary | ICD-10-CM | POA: Diagnosis not present

## 2020-07-29 DIAGNOSIS — K219 Gastro-esophageal reflux disease without esophagitis: Secondary | ICD-10-CM | POA: Diagnosis not present

## 2020-08-17 DIAGNOSIS — G4700 Insomnia, unspecified: Secondary | ICD-10-CM | POA: Diagnosis not present

## 2020-08-17 DIAGNOSIS — K219 Gastro-esophageal reflux disease without esophagitis: Secondary | ICD-10-CM | POA: Diagnosis not present

## 2020-08-17 DIAGNOSIS — M150 Primary generalized (osteo)arthritis: Secondary | ICD-10-CM | POA: Diagnosis not present

## 2020-08-17 DIAGNOSIS — Z1231 Encounter for screening mammogram for malignant neoplasm of breast: Secondary | ICD-10-CM | POA: Diagnosis not present

## 2020-08-17 DIAGNOSIS — G8929 Other chronic pain: Secondary | ICD-10-CM | POA: Diagnosis not present

## 2020-08-17 DIAGNOSIS — E7800 Pure hypercholesterolemia, unspecified: Secondary | ICD-10-CM | POA: Diagnosis not present

## 2020-08-17 DIAGNOSIS — F321 Major depressive disorder, single episode, moderate: Secondary | ICD-10-CM | POA: Diagnosis not present

## 2020-08-17 DIAGNOSIS — M810 Age-related osteoporosis without current pathological fracture: Secondary | ICD-10-CM | POA: Diagnosis not present

## 2020-08-18 DIAGNOSIS — M533 Sacrococcygeal disorders, not elsewhere classified: Secondary | ICD-10-CM | POA: Diagnosis not present

## 2020-08-21 DIAGNOSIS — G8929 Other chronic pain: Secondary | ICD-10-CM | POA: Diagnosis not present

## 2020-08-21 DIAGNOSIS — K219 Gastro-esophageal reflux disease without esophagitis: Secondary | ICD-10-CM | POA: Diagnosis not present

## 2020-08-21 DIAGNOSIS — F419 Anxiety disorder, unspecified: Secondary | ICD-10-CM | POA: Diagnosis not present

## 2020-09-26 IMAGING — CR DG LUMBAR SPINE 2-3V
2 series · 2 of 2 positions shown · non-contrast
Comparison: None.

CLINICAL DATA: L5-S1 discectomy.

EXAM:
LUMBAR SPINE - 2-3 VIEW

[xtable lateral (1 of 2)]
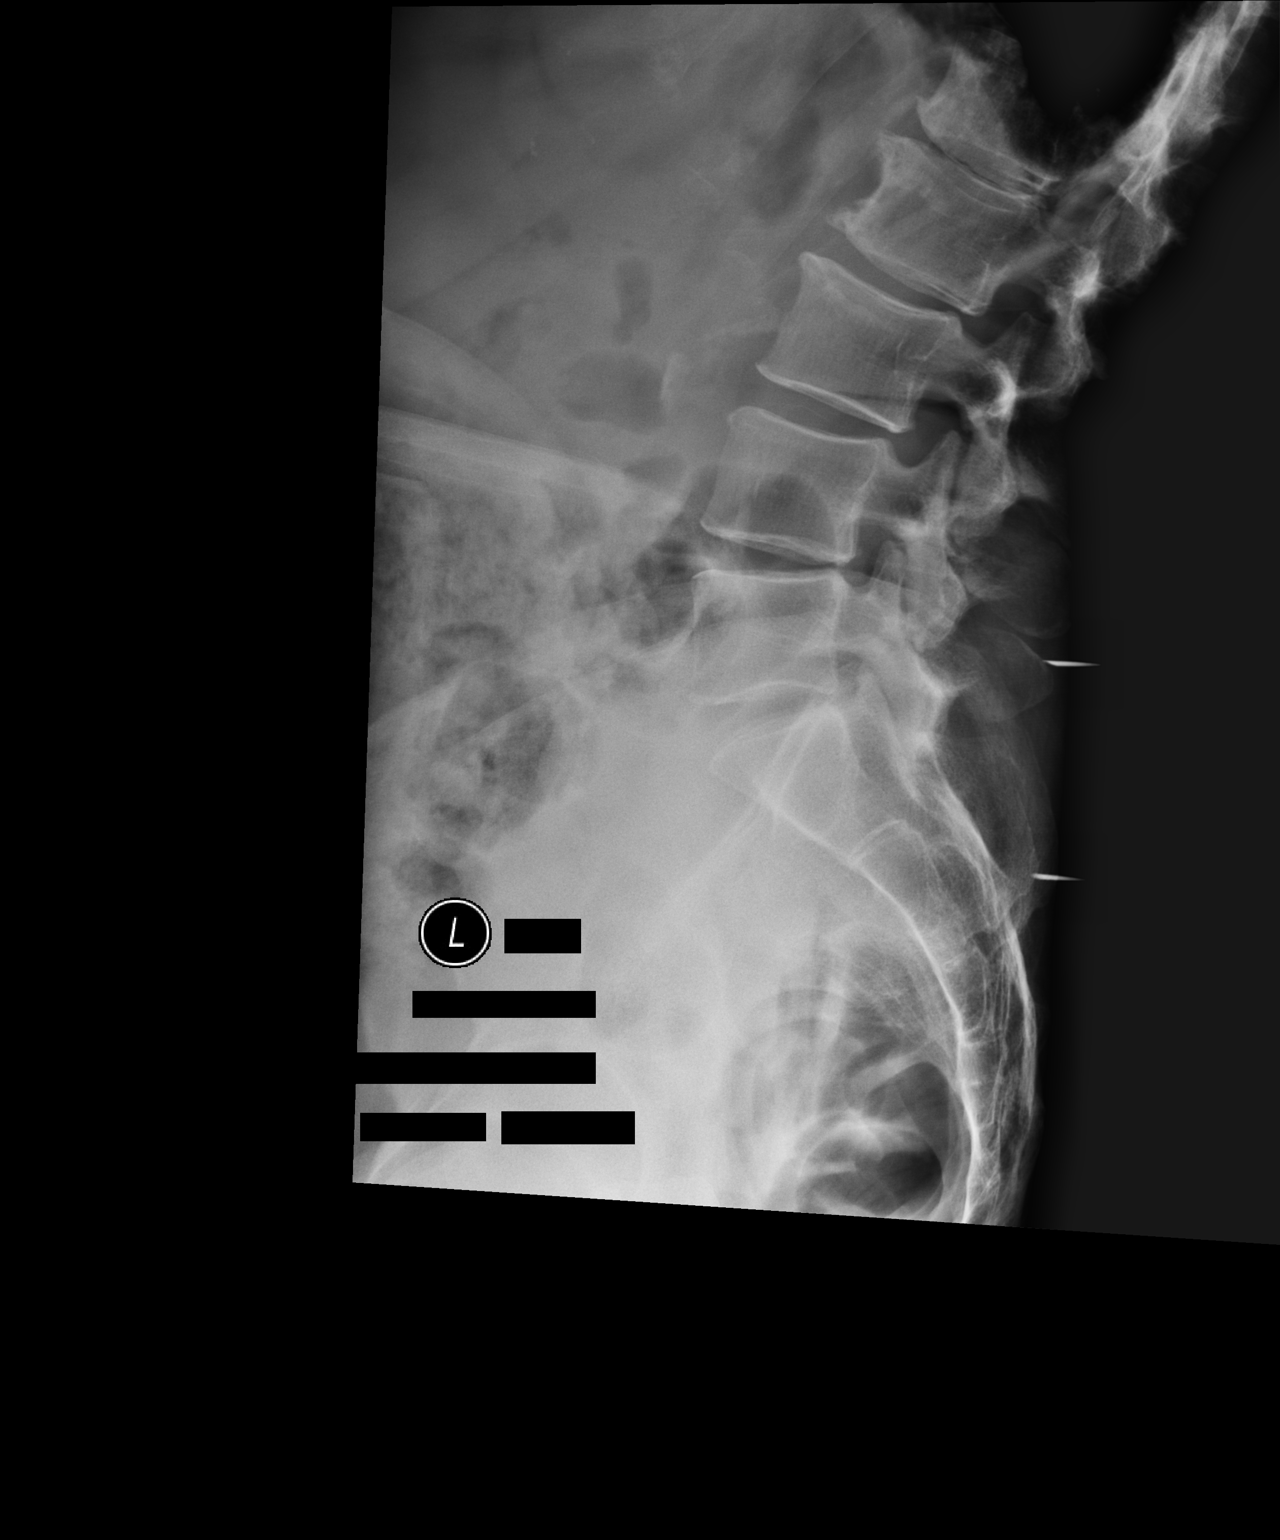

[xtable lateral (2 of 2)]
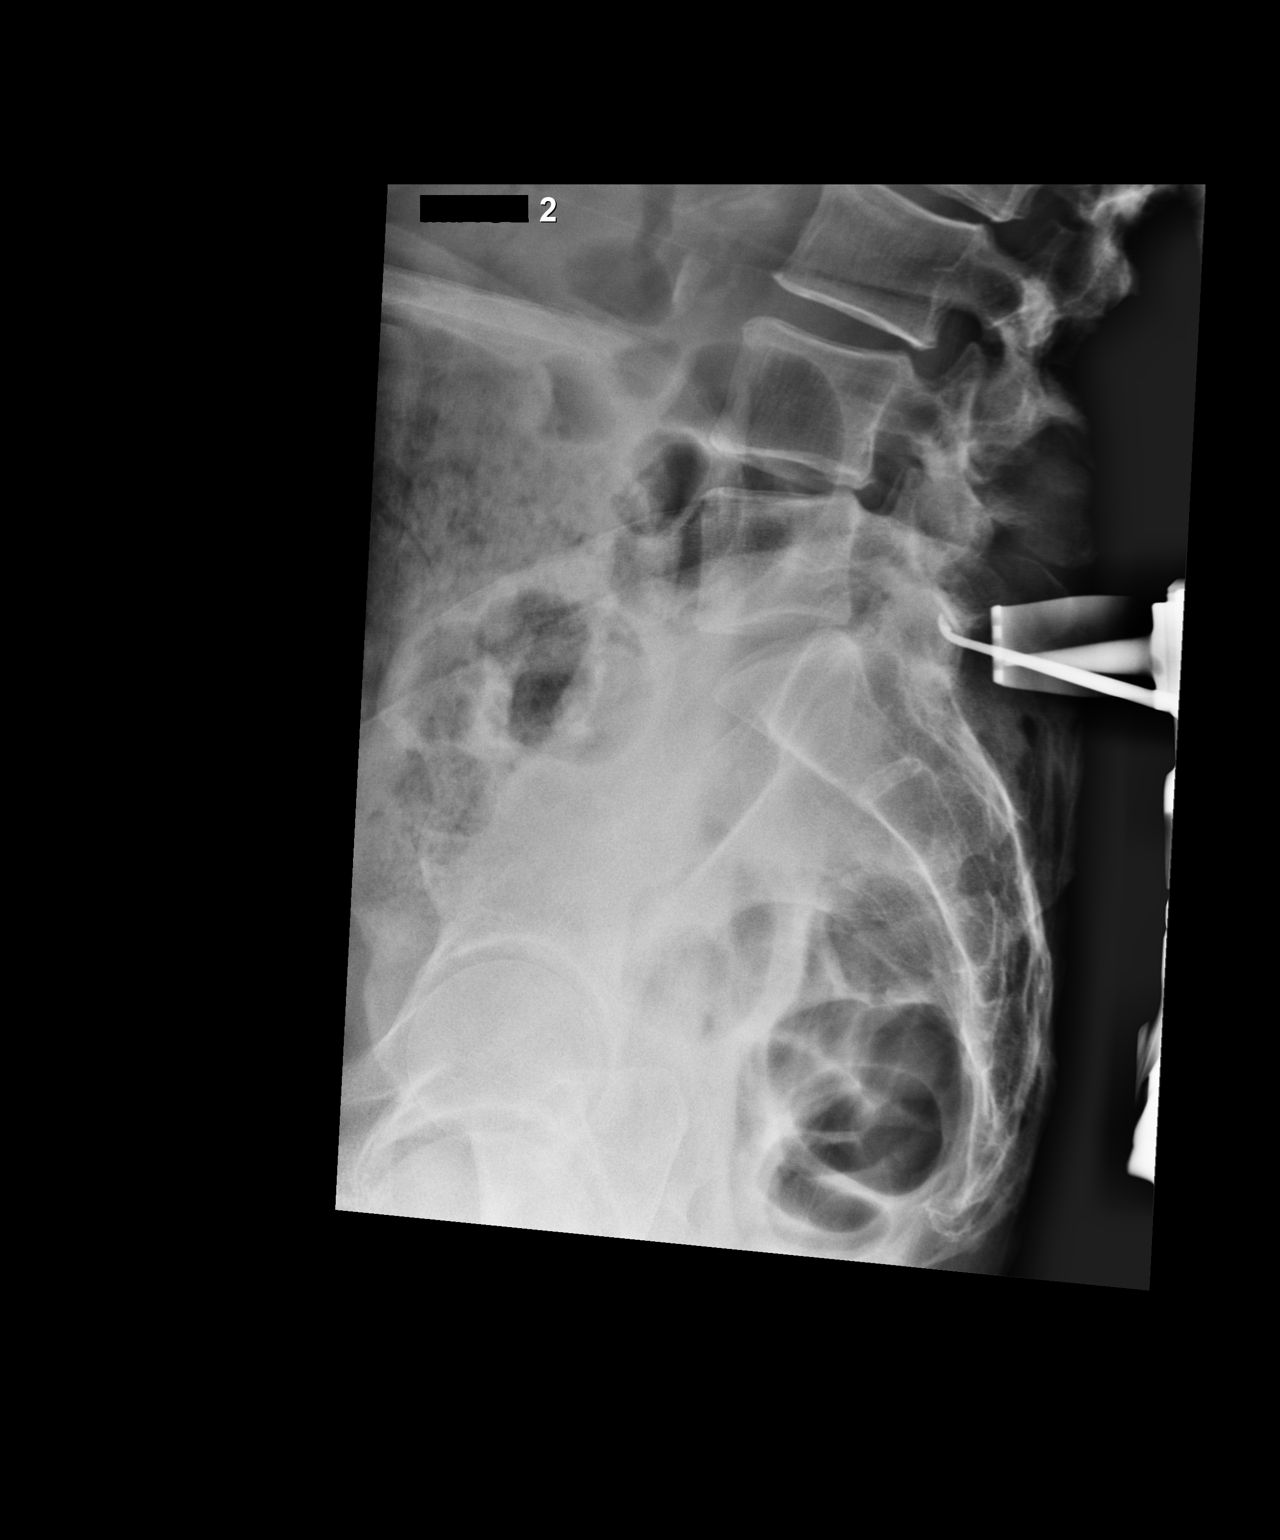

[2 of 2 positions shown; findings below may reference images not displayed]

FINDINGS: There is no evidence of an acute lumbar spine fracture. The initial
plain film image shows radiopaque localization needles overlying the
posterior aspect of the lower lumbar spine, just above the level of
L5-S1, and posterior aspect of the mid to lower sacrum. The
subsequent cross-table lateral plain film image shows radiopaque
surgical instrumentation overlying the posterior elements at the
level of L5-S1. Alignment is normal. Mild endplate sclerosis is seen
at the levels of L1-L2 and L2-L3. Mild multilevel intervertebral
disc space narrowing is noted.
IMPRESSION: Intraoperative localization of the lower lumbar spine.

## 2020-10-22 DIAGNOSIS — E7800 Pure hypercholesterolemia, unspecified: Secondary | ICD-10-CM | POA: Diagnosis not present

## 2020-10-22 DIAGNOSIS — H5200 Hypermetropia, unspecified eye: Secondary | ICD-10-CM | POA: Diagnosis not present

## 2020-10-22 DIAGNOSIS — Z0100 Encounter for examination of eyes and vision without abnormal findings: Secondary | ICD-10-CM | POA: Diagnosis not present

## 2020-12-28 DIAGNOSIS — K5010 Crohn's disease of large intestine without complications: Secondary | ICD-10-CM | POA: Diagnosis not present

## 2020-12-28 DIAGNOSIS — Z0000 Encounter for general adult medical examination without abnormal findings: Secondary | ICD-10-CM | POA: Diagnosis not present

## 2020-12-28 DIAGNOSIS — F419 Anxiety disorder, unspecified: Secondary | ICD-10-CM | POA: Diagnosis not present

## 2020-12-28 DIAGNOSIS — G5621 Lesion of ulnar nerve, right upper limb: Secondary | ICD-10-CM | POA: Diagnosis not present

## 2020-12-28 DIAGNOSIS — G8929 Other chronic pain: Secondary | ICD-10-CM | POA: Diagnosis not present

## 2020-12-28 DIAGNOSIS — F321 Major depressive disorder, single episode, moderate: Secondary | ICD-10-CM | POA: Diagnosis not present

## 2020-12-28 DIAGNOSIS — F17210 Nicotine dependence, cigarettes, uncomplicated: Secondary | ICD-10-CM | POA: Diagnosis not present

## 2020-12-28 DIAGNOSIS — E7800 Pure hypercholesterolemia, unspecified: Secondary | ICD-10-CM | POA: Diagnosis not present

## 2020-12-28 DIAGNOSIS — M810 Age-related osteoporosis without current pathological fracture: Secondary | ICD-10-CM | POA: Diagnosis not present

## 2021-01-05 DIAGNOSIS — K219 Gastro-esophageal reflux disease without esophagitis: Secondary | ICD-10-CM | POA: Diagnosis not present

## 2021-01-05 DIAGNOSIS — K5080 Crohn's disease of both small and large intestine without complications: Secondary | ICD-10-CM | POA: Diagnosis not present

## 2021-01-13 DIAGNOSIS — M810 Age-related osteoporosis without current pathological fracture: Secondary | ICD-10-CM | POA: Diagnosis not present

## 2021-05-25 DIAGNOSIS — Z780 Asymptomatic menopausal state: Secondary | ICD-10-CM | POA: Diagnosis not present

## 2021-05-25 DIAGNOSIS — M85852 Other specified disorders of bone density and structure, left thigh: Secondary | ICD-10-CM | POA: Diagnosis not present

## 2021-05-25 DIAGNOSIS — M818 Other osteoporosis without current pathological fracture: Secondary | ICD-10-CM | POA: Diagnosis not present

## 2021-05-31 DIAGNOSIS — I700 Atherosclerosis of aorta: Secondary | ICD-10-CM | POA: Diagnosis not present

## 2021-05-31 DIAGNOSIS — K7689 Other specified diseases of liver: Secondary | ICD-10-CM | POA: Diagnosis not present

## 2021-05-31 DIAGNOSIS — M5126 Other intervertebral disc displacement, lumbar region: Secondary | ICD-10-CM | POA: Diagnosis not present

## 2021-05-31 DIAGNOSIS — K769 Liver disease, unspecified: Secondary | ICD-10-CM | POA: Diagnosis not present

## 2021-05-31 DIAGNOSIS — K50819 Crohn's disease of both small and large intestine with unspecified complications: Secondary | ICD-10-CM | POA: Diagnosis not present

## 2021-05-31 DIAGNOSIS — R1031 Right lower quadrant pain: Secondary | ICD-10-CM | POA: Diagnosis not present

## 2021-06-23 DIAGNOSIS — M818 Other osteoporosis without current pathological fracture: Secondary | ICD-10-CM | POA: Diagnosis not present

## 2021-06-29 DIAGNOSIS — K50812 Crohn's disease of both small and large intestine with intestinal obstruction: Secondary | ICD-10-CM | POA: Diagnosis not present

## 2021-06-29 DIAGNOSIS — K648 Other hemorrhoids: Secondary | ICD-10-CM | POA: Diagnosis not present

## 2021-06-29 DIAGNOSIS — R1031 Right lower quadrant pain: Secondary | ICD-10-CM | POA: Diagnosis not present

## 2021-06-29 DIAGNOSIS — K50819 Crohn's disease of both small and large intestine with unspecified complications: Secondary | ICD-10-CM | POA: Diagnosis not present

## 2021-07-19 DIAGNOSIS — F419 Anxiety disorder, unspecified: Secondary | ICD-10-CM | POA: Diagnosis not present

## 2021-07-19 DIAGNOSIS — M810 Age-related osteoporosis without current pathological fracture: Secondary | ICD-10-CM | POA: Diagnosis not present

## 2021-07-19 DIAGNOSIS — M5137 Other intervertebral disc degeneration, lumbosacral region: Secondary | ICD-10-CM | POA: Diagnosis not present

## 2021-07-19 DIAGNOSIS — G4700 Insomnia, unspecified: Secondary | ICD-10-CM | POA: Diagnosis not present

## 2021-07-19 DIAGNOSIS — G8929 Other chronic pain: Secondary | ICD-10-CM | POA: Diagnosis not present

## 2021-07-19 DIAGNOSIS — F17210 Nicotine dependence, cigarettes, uncomplicated: Secondary | ICD-10-CM | POA: Diagnosis not present

## 2021-07-19 DIAGNOSIS — K5010 Crohn's disease of large intestine without complications: Secondary | ICD-10-CM | POA: Diagnosis not present

## 2021-07-19 DIAGNOSIS — E7800 Pure hypercholesterolemia, unspecified: Secondary | ICD-10-CM | POA: Diagnosis not present

## 2021-07-20 DIAGNOSIS — M810 Age-related osteoporosis without current pathological fracture: Secondary | ICD-10-CM | POA: Diagnosis not present

## 2021-08-24 DIAGNOSIS — Z1231 Encounter for screening mammogram for malignant neoplasm of breast: Secondary | ICD-10-CM | POA: Diagnosis not present

## 2021-10-12 DIAGNOSIS — Z79899 Other long term (current) drug therapy: Secondary | ICD-10-CM | POA: Diagnosis not present

## 2021-10-12 DIAGNOSIS — L719 Rosacea, unspecified: Secondary | ICD-10-CM | POA: Diagnosis not present

## 2021-10-12 DIAGNOSIS — L659 Nonscarring hair loss, unspecified: Secondary | ICD-10-CM | POA: Diagnosis not present

## 2021-10-12 DIAGNOSIS — R233 Spontaneous ecchymoses: Secondary | ICD-10-CM | POA: Diagnosis not present

## 2021-10-12 DIAGNOSIS — G8929 Other chronic pain: Secondary | ICD-10-CM | POA: Diagnosis not present

## 2021-10-12 DIAGNOSIS — R5383 Other fatigue: Secondary | ICD-10-CM | POA: Diagnosis not present

## 2022-01-04 DIAGNOSIS — F17210 Nicotine dependence, cigarettes, uncomplicated: Secondary | ICD-10-CM | POA: Diagnosis not present

## 2022-01-04 DIAGNOSIS — E7800 Pure hypercholesterolemia, unspecified: Secondary | ICD-10-CM | POA: Diagnosis not present

## 2022-01-04 DIAGNOSIS — F419 Anxiety disorder, unspecified: Secondary | ICD-10-CM | POA: Diagnosis not present

## 2022-01-04 DIAGNOSIS — K219 Gastro-esophageal reflux disease without esophagitis: Secondary | ICD-10-CM | POA: Diagnosis not present

## 2022-01-04 DIAGNOSIS — Z0000 Encounter for general adult medical examination without abnormal findings: Secondary | ICD-10-CM | POA: Diagnosis not present

## 2022-01-04 DIAGNOSIS — G8929 Other chronic pain: Secondary | ICD-10-CM | POA: Diagnosis not present

## 2022-01-04 DIAGNOSIS — K5010 Crohn's disease of large intestine without complications: Secondary | ICD-10-CM | POA: Diagnosis not present

## 2022-01-08 DIAGNOSIS — J40 Bronchitis, not specified as acute or chronic: Secondary | ICD-10-CM | POA: Diagnosis not present

## 2022-01-08 DIAGNOSIS — F17210 Nicotine dependence, cigarettes, uncomplicated: Secondary | ICD-10-CM | POA: Diagnosis not present

## 2022-01-15 DIAGNOSIS — J0140 Acute pansinusitis, unspecified: Secondary | ICD-10-CM | POA: Diagnosis not present

## 2022-01-15 DIAGNOSIS — R062 Wheezing: Secondary | ICD-10-CM | POA: Diagnosis not present

## 2022-01-27 DIAGNOSIS — F17210 Nicotine dependence, cigarettes, uncomplicated: Secondary | ICD-10-CM | POA: Diagnosis not present

## 2022-01-27 DIAGNOSIS — J019 Acute sinusitis, unspecified: Secondary | ICD-10-CM | POA: Diagnosis not present

## 2022-05-30 DIAGNOSIS — K50819 Crohn's disease of both small and large intestine with unspecified complications: Secondary | ICD-10-CM | POA: Diagnosis not present

## 2022-06-15 DIAGNOSIS — K5080 Crohn's disease of both small and large intestine without complications: Secondary | ICD-10-CM | POA: Diagnosis not present

## 2022-06-29 DIAGNOSIS — M25531 Pain in right wrist: Secondary | ICD-10-CM | POA: Diagnosis not present

## 2022-07-06 DIAGNOSIS — M25531 Pain in right wrist: Secondary | ICD-10-CM | POA: Diagnosis not present

## 2022-07-11 DIAGNOSIS — G4700 Insomnia, unspecified: Secondary | ICD-10-CM | POA: Diagnosis not present

## 2022-07-11 DIAGNOSIS — E7800 Pure hypercholesterolemia, unspecified: Secondary | ICD-10-CM | POA: Diagnosis not present

## 2022-07-11 DIAGNOSIS — G8929 Other chronic pain: Secondary | ICD-10-CM | POA: Diagnosis not present

## 2022-07-11 DIAGNOSIS — F17210 Nicotine dependence, cigarettes, uncomplicated: Secondary | ICD-10-CM | POA: Diagnosis not present

## 2022-07-11 DIAGNOSIS — F321 Major depressive disorder, single episode, moderate: Secondary | ICD-10-CM | POA: Diagnosis not present

## 2022-07-11 DIAGNOSIS — M810 Age-related osteoporosis without current pathological fracture: Secondary | ICD-10-CM | POA: Diagnosis not present

## 2022-07-11 DIAGNOSIS — F419 Anxiety disorder, unspecified: Secondary | ICD-10-CM | POA: Diagnosis not present

## 2022-07-11 DIAGNOSIS — I700 Atherosclerosis of aorta: Secondary | ICD-10-CM | POA: Diagnosis not present

## 2022-07-11 DIAGNOSIS — K5010 Crohn's disease of large intestine without complications: Secondary | ICD-10-CM | POA: Diagnosis not present

## 2022-08-05 DIAGNOSIS — M25531 Pain in right wrist: Secondary | ICD-10-CM | POA: Diagnosis not present

## 2022-08-18 DIAGNOSIS — M25531 Pain in right wrist: Secondary | ICD-10-CM | POA: Diagnosis not present

## 2022-08-18 DIAGNOSIS — M19031 Primary osteoarthritis, right wrist: Secondary | ICD-10-CM | POA: Diagnosis not present

## 2022-09-07 DIAGNOSIS — M810 Age-related osteoporosis without current pathological fracture: Secondary | ICD-10-CM | POA: Diagnosis not present

## 2022-10-05 ENCOUNTER — Ambulatory Visit (INDEPENDENT_AMBULATORY_CARE_PROVIDER_SITE_OTHER): Payer: Medicare HMO | Admitting: Obstetrics and Gynecology

## 2022-10-05 ENCOUNTER — Encounter: Payer: Self-pay | Admitting: Obstetrics and Gynecology

## 2022-10-05 ENCOUNTER — Other Ambulatory Visit (HOSPITAL_COMMUNITY)
Admission: RE | Admit: 2022-10-05 | Discharge: 2022-10-05 | Disposition: A | Discharge status: Home / Self Care | Payer: Medicare HMO | Source: Ambulatory Visit | Attending: Obstetrics and Gynecology | Admitting: Obstetrics and Gynecology

## 2022-10-05 VITALS — BP 111/71 | HR 66 | Ht 65.0 in | Wt 120.0 lb

## 2022-10-05 DIAGNOSIS — A630 Anogenital (venereal) warts: Secondary | ICD-10-CM | POA: Diagnosis not present

## 2022-10-05 DIAGNOSIS — Z1339 Encounter for screening examination for other mental health and behavioral disorders: Secondary | ICD-10-CM

## 2022-10-05 DIAGNOSIS — Z01419 Encounter for gynecological examination (general) (routine) without abnormal findings: Secondary | ICD-10-CM | POA: Diagnosis not present

## 2022-10-05 DIAGNOSIS — N9089 Other specified noninflammatory disorders of vulva and perineum: Secondary | ICD-10-CM | POA: Diagnosis not present

## 2022-10-05 DIAGNOSIS — N6452 Nipple discharge: Secondary | ICD-10-CM | POA: Diagnosis not present

## 2022-10-05 MED ORDER — IMIQUIMOD 5 % EX CREA: TOPICAL_CREAM | CUTANEOUS | 1 refills | Status: AC

## 2022-10-05 NOTE — Patient Instructions (Signed)
Apply the cream every other day for a total of 3 days per week.  Leave cream on for 6-10 hours then wash off in the shower. You can do this for up to 16 weeks

## 2022-10-05 NOTE — Progress Notes (Signed)
New GYN presents for AEX.  H/o Hysterectomy. C/o discharge, blood from both Breast nipples, genital warts.

## 2022-10-07 LAB — SURGICAL PATHOLOGY

## 2022-10-07 NOTE — Progress Notes (Signed)
ANNUAL EXAM Patient name: Kendra [name] MRN [id number]  Date of birth: 04-May-1960 Chief Complaint:   New Patient (Initial Visit)  History of Present Illness:   Kendra [name] is a 62 y.o. menopausal G4P2 s/p hysterectomy being seen today for a routine annual exam.  Current complaints:   Bloody/brown nipple discharge - occurs only with expression. Occurs bilaterally. No pain or breast mass Genital warts - has not tried anything for treatment yet  Last pap n/a, s/p hysterectomy denies abnormal paps prior to hyst Last mammogram: 08/24/21. Results were: normal.  Last colonoscopy: 06/29/21. Results were: normal     10/05/2022   11:00 AM  Depression screen PHQ 2/9  Decreased Interest 1  Down, Depressed, Hopeless 1  PHQ - 2 Score 2  Altered sleeping 1  Tired, decreased energy 1  Change in appetite 0  Feeling bad or failure about yourself  0  Trouble concentrating 1  Moving slowly or fidgety/restless 0  Suicidal thoughts 0  PHQ-9 Score 5  Difficult doing work/chores Not difficult at all        10/05/2022   11:01 AM  GAD 7 : Generalized Anxiety Score  Nervous, Anxious, on Edge 1  Control/stop worrying 1  Worry too much - different things 0  Trouble relaxing 0  Restless 0  Easily annoyed or irritable 0  Afraid - awful might happen 0  Total GAD 7 Score 2  Anxiety Difficulty Not difficult at all     Review of Systems:   Pertinent items are noted in HPI Denies any headaches, blurred vision, fatigue, shortness of breath, chest pain, abdominal pain, abnormal vaginal discharge/itching/odor/irritation, problems with periods, bowel movements, urination, or intercourse unless otherwise stated above. Pertinent History Reviewed:  Reviewed past medical,surgical, social and family history.  Reviewed problem list, medications and allergies. Physical Assessment:   Vitals:   10/05/22 1036  BP: 111/71  Pulse: 66  Weight: 120 lb (54.4 kg)  Height: 5\' 5"  (1.651 m)  Body mass  index is 19.97 kg/m.        Physical Examination:   General appearance - well appearing, and in no distress  Mental status - alert, oriented to person, place, and time  Chest - respiratory effort normal  Heart - normal peripheral perfusion  Breasts - breasts appear normal, no suspicious masses, skin changes or axillary nodes. +brown nipple discharge expressed from right breast, white discharge from left breast  Abdomen - soft, nontender, nondistended, no masses or organomegaly  Pelvic - VULVA: small verrucous fleshy lesions c/w perianal warts. Had 5mm hyperpigmented lesion on left buttock with regular borders - removed with shave biopsy. See procedure note  Chaperone present for exam  No results found for this or any previous visit (from the past 24 hour(s)).  Assessment & Plan:  1) Well-Woman Exam Mammogram: diagnostic mammogram ordered for abnormal nipple discharge Colonoscopy: up to date Pap: n/a - s/p hysterectomy for benign indications, no prior abnormal paps  2) Bloody discharge from nipple -     MM 3D DIAGNOSTIC MAMMOGRAM BILATERAL BREAST; Future  3) Vulvar lesion -     Surgical pathology( [place] [facility]/ [facility]) -     imiquimod (ALDARA) 5 % cream; Apply topically 3 (three) times a week. Apply until total clearance or maximum of 16 weeks  Labs/procedures today:   Orders Placed This Encounter  Procedures   MM 3D DIAGNOSTIC MAMMOGRAM BILATERAL BREAST   Meds:  Meds ordered this encounter  Medications   imiquimod (ALDARA) 5 %  cream    Sig: Apply topically 3 (three) times a week. Apply until total clearance or maximum of 16 weeks    Dispense:  24 each    Refill:  1   Follow-up: Return in about 4 weeks (around 11/02/2022) for follow up warts & biopsy.  Lennart Pall, MD 10/07/2022 4:28 PM

## 2022-10-07 NOTE — Progress Notes (Signed)
    GYNECOLOGY OFFICE VULVAR BIOPSY PROCEDURE NOTE  62 y.o. [id number] here for vulvar biopsy  Informed consent and review of risks, benefit and alternatives performed. Written consent given.   5mm hyperpigmented lesion with raised edges and smooth borders noted at on the left buttock/perineum. Area prepped with betadine. 1cc of lidocaine 1% was injected into the skin surrounding the lesion. A scalpel was used to do a shave biopsy and completely remove the lesion. Silver nitrate applied to biopsy site for good hemostasis.  Pt tolerated well with minimal pain and bleeding.   All specimens were labeled and sent to pathology.  Patient was given post procedure instructions.  Will follow up pathology and manage accordingly; patient will be contacted with results and recommendations.  Routine preventative health maintenance measures emphasized.  Harvie Bridge, MD Obstetrician & Gynecologist, [facility] for [employer], [facility]

## 2022-10-10 ENCOUNTER — Other Ambulatory Visit: Payer: Self-pay | Admitting: Obstetrics and Gynecology

## 2022-10-10 DIAGNOSIS — N6452 Nipple discharge: Secondary | ICD-10-CM

## 2022-10-14 DIAGNOSIS — M19031 Primary osteoarthritis, right wrist: Secondary | ICD-10-CM | POA: Diagnosis not present

## 2022-11-01 ENCOUNTER — Ambulatory Visit (INDEPENDENT_AMBULATORY_CARE_PROVIDER_SITE_OTHER): Payer: Medicare HMO | Admitting: Obstetrics and Gynecology

## 2022-11-01 ENCOUNTER — Encounter: Payer: Self-pay | Admitting: Obstetrics and Gynecology

## 2022-11-01 VITALS — BP 126/70 | HR 61 | Ht 65.0 in | Wt 120.0 lb

## 2022-11-01 DIAGNOSIS — N6452 Nipple discharge: Secondary | ICD-10-CM

## 2022-11-01 DIAGNOSIS — L821 Other seborrheic keratosis: Secondary | ICD-10-CM

## 2022-11-01 DIAGNOSIS — A630 Anogenital (venereal) warts: Secondary | ICD-10-CM | POA: Diagnosis not present

## 2022-11-02 NOTE — Progress Notes (Signed)
   RETURN GYNECOLOGY VISIT  Subjective:  Kendra [name] is a 62 y.o. menopausal [phone] presenting for follow up of genital warts and nipple discharge  Started aldara for genital warts 9/4. Also had biopsy of hyperpigmented vulvar lesion during that visit - benign seborrheic keratosis. Has not seen much improvement yet.   Has diagnostic mammogram scheduled 10/4.   Objective:   Vitals:   11/01/22 1538  BP: 126/70  Pulse: 61  Weight: 120 lb (54.4 kg)  Height: 5\' 5"  (1.651 m)   General:  Alert, oriented and cooperative. Patient is in no acute distress.  Skin: Skin is warm and dry. No rash noted.   Cardiovascular: Normal heart rate noted  Respiratory: Normal respiratory effort, no problems with respiration noted   Assessment and Plan:  Kendra [name] is a 62 y.o. with genital warts and nipple discharge  Reviewed benign biopsy and diagnosis of seborrheic keratosis  Continue aldara 3x/wk for up to 16 weeks. Will follow up at the end of December/early January to assess response. Discussed alternative options for management include TCA, cryotherapy, laser ablation, or excision.   Discussed that we are obtaining diagnostic mammogram to rule out any cancerous or pre cancerous process since she is having unilateral brown/bloody nipple discharge.   I spent a total of 20 minutes reviewing the patient's chart, counseling the patient and documenting our encounter.   Future Appointments  Date Time Provider Department Center  11/04/2022  9:40 AM [facility] DIAG [place] 1 [facility]  11/04/2022  9:50 AM [facility] [place] 1 [facility]  11/04/2022  9:55 AM [facility] [place] 1 [facility]   Lennart Pall, MD

## 2022-11-04 ENCOUNTER — Ambulatory Visit
Admission: RE | Admit: 2022-11-04 | Discharge: 2022-11-04 | Disposition: A | Discharge status: Home / Self Care | Payer: Medicare HMO | Source: Ambulatory Visit | Attending: Obstetrics and Gynecology | Admitting: Obstetrics and Gynecology

## 2022-11-04 ENCOUNTER — Ambulatory Visit
Admission: RE | Admit: 2022-11-04 | Discharge: 2022-11-04 | Disposition: A | Discharge status: Home / Self Care | Payer: Medicare HMO | Source: Ambulatory Visit | Attending: Obstetrics and Gynecology

## 2022-11-04 DIAGNOSIS — N6452 Nipple discharge: Secondary | ICD-10-CM

## 2022-11-30 DIAGNOSIS — M25531 Pain in right wrist: Secondary | ICD-10-CM | POA: Diagnosis not present

## 2022-11-30 DIAGNOSIS — S6981XD Other specified injuries of right wrist, hand and finger(s), subsequent encounter: Secondary | ICD-10-CM | POA: Diagnosis not present

## 2022-12-05 DIAGNOSIS — M79631 Pain in right forearm: Secondary | ICD-10-CM | POA: Diagnosis not present

## 2022-12-05 DIAGNOSIS — S6981XD Other specified injuries of right wrist, hand and finger(s), subsequent encounter: Secondary | ICD-10-CM | POA: Diagnosis not present

## 2022-12-23 DIAGNOSIS — J018 Other acute sinusitis: Secondary | ICD-10-CM | POA: Diagnosis not present

## 2022-12-23 DIAGNOSIS — R051 Acute cough: Secondary | ICD-10-CM | POA: Diagnosis not present

## 2023-01-06 DIAGNOSIS — Z0000 Encounter for general adult medical examination without abnormal findings: Secondary | ICD-10-CM | POA: Diagnosis not present

## 2023-01-06 DIAGNOSIS — F419 Anxiety disorder, unspecified: Secondary | ICD-10-CM | POA: Diagnosis not present

## 2023-01-06 DIAGNOSIS — M810 Age-related osteoporosis without current pathological fracture: Secondary | ICD-10-CM | POA: Diagnosis not present

## 2023-01-06 DIAGNOSIS — F17210 Nicotine dependence, cigarettes, uncomplicated: Secondary | ICD-10-CM | POA: Diagnosis not present

## 2023-01-06 DIAGNOSIS — F321 Major depressive disorder, single episode, moderate: Secondary | ICD-10-CM | POA: Diagnosis not present

## 2023-01-06 DIAGNOSIS — I700 Atherosclerosis of aorta: Secondary | ICD-10-CM | POA: Diagnosis not present

## 2023-01-06 DIAGNOSIS — G8929 Other chronic pain: Secondary | ICD-10-CM | POA: Diagnosis not present

## 2023-01-06 DIAGNOSIS — J0101 Acute recurrent maxillary sinusitis: Secondary | ICD-10-CM | POA: Diagnosis not present

## 2023-01-06 DIAGNOSIS — E7800 Pure hypercholesterolemia, unspecified: Secondary | ICD-10-CM | POA: Diagnosis not present

## 2023-01-06 DIAGNOSIS — K5010 Crohn's disease of large intestine without complications: Secondary | ICD-10-CM | POA: Diagnosis not present

## 2023-01-09 DIAGNOSIS — M19039 Primary osteoarthritis, unspecified wrist: Secondary | ICD-10-CM | POA: Diagnosis not present

## 2023-01-09 DIAGNOSIS — M25531 Pain in right wrist: Secondary | ICD-10-CM | POA: Diagnosis not present

## 2023-01-16 DIAGNOSIS — M19039 Primary osteoarthritis, unspecified wrist: Secondary | ICD-10-CM | POA: Diagnosis not present

## 2023-01-16 DIAGNOSIS — M19031 Primary osteoarthritis, right wrist: Secondary | ICD-10-CM | POA: Diagnosis not present

## 2023-01-16 DIAGNOSIS — M25531 Pain in right wrist: Secondary | ICD-10-CM | POA: Diagnosis not present

## 2023-01-16 DIAGNOSIS — G8929 Other chronic pain: Secondary | ICD-10-CM | POA: Diagnosis not present

## 2023-02-06 DIAGNOSIS — M19039 Primary osteoarthritis, unspecified wrist: Secondary | ICD-10-CM | POA: Diagnosis not present

## 2023-02-28 DIAGNOSIS — Z419 Encounter for procedure for purposes other than remedying health state, unspecified: Secondary | ICD-10-CM | POA: Diagnosis not present

## 2023-02-28 DIAGNOSIS — G8918 Other acute postprocedural pain: Secondary | ICD-10-CM | POA: Diagnosis not present

## 2023-02-28 DIAGNOSIS — M19031 Primary osteoarthritis, right wrist: Secondary | ICD-10-CM | POA: Diagnosis not present

## 2023-03-01 DIAGNOSIS — M19031 Primary osteoarthritis, right wrist: Secondary | ICD-10-CM | POA: Diagnosis not present

## 2023-03-01 DIAGNOSIS — Z4789 Encounter for other orthopedic aftercare: Secondary | ICD-10-CM | POA: Diagnosis not present

## 2023-03-10 DIAGNOSIS — L814 Other melanin hyperpigmentation: Secondary | ICD-10-CM | POA: Diagnosis not present

## 2023-03-10 DIAGNOSIS — Q2739 Arteriovenous malformation, other site: Secondary | ICD-10-CM | POA: Diagnosis not present

## 2023-03-10 DIAGNOSIS — L603 Nail dystrophy: Secondary | ICD-10-CM | POA: Diagnosis not present

## 2023-03-10 DIAGNOSIS — L658 Other specified nonscarring hair loss: Secondary | ICD-10-CM | POA: Diagnosis not present

## 2023-03-10 DIAGNOSIS — D1801 Hemangioma of skin and subcutaneous tissue: Secondary | ICD-10-CM | POA: Diagnosis not present

## 2023-03-10 DIAGNOSIS — M19031 Primary osteoarthritis, right wrist: Secondary | ICD-10-CM | POA: Diagnosis not present

## 2023-03-10 DIAGNOSIS — D489 Neoplasm of uncertain behavior, unspecified: Secondary | ICD-10-CM | POA: Diagnosis not present

## 2023-03-15 DIAGNOSIS — Z4789 Encounter for other orthopedic aftercare: Secondary | ICD-10-CM | POA: Diagnosis not present

## 2023-03-15 DIAGNOSIS — M19031 Primary osteoarthritis, right wrist: Secondary | ICD-10-CM | POA: Diagnosis not present

## 2023-03-15 DIAGNOSIS — M19039 Primary osteoarthritis, unspecified wrist: Secondary | ICD-10-CM | POA: Diagnosis not present

## 2023-03-15 DIAGNOSIS — M25531 Pain in right wrist: Secondary | ICD-10-CM | POA: Diagnosis not present

## 2023-04-04 DIAGNOSIS — M810 Age-related osteoporosis without current pathological fracture: Secondary | ICD-10-CM | POA: Diagnosis not present

## 2023-04-12 DIAGNOSIS — R202 Paresthesia of skin: Secondary | ICD-10-CM | POA: Diagnosis not present

## 2023-04-12 DIAGNOSIS — M19039 Primary osteoarthritis, unspecified wrist: Secondary | ICD-10-CM | POA: Diagnosis not present

## 2023-04-12 DIAGNOSIS — R200 Anesthesia of skin: Secondary | ICD-10-CM | POA: Diagnosis not present

## 2023-04-21 DIAGNOSIS — R202 Paresthesia of skin: Secondary | ICD-10-CM | POA: Diagnosis not present

## 2023-04-21 DIAGNOSIS — R200 Anesthesia of skin: Secondary | ICD-10-CM | POA: Diagnosis not present

## 2023-04-21 DIAGNOSIS — G5621 Lesion of ulnar nerve, right upper limb: Secondary | ICD-10-CM | POA: Diagnosis not present

## 2023-05-22 DIAGNOSIS — G5621 Lesion of ulnar nerve, right upper limb: Secondary | ICD-10-CM | POA: Diagnosis not present

## 2023-05-30 DIAGNOSIS — M810 Age-related osteoporosis without current pathological fracture: Secondary | ICD-10-CM | POA: Diagnosis not present

## 2023-05-30 DIAGNOSIS — Z780 Asymptomatic menopausal state: Secondary | ICD-10-CM | POA: Diagnosis not present

## 2023-06-07 DIAGNOSIS — G5621 Lesion of ulnar nerve, right upper limb: Secondary | ICD-10-CM | POA: Diagnosis not present

## 2023-06-12 DIAGNOSIS — M25521 Pain in right elbow: Secondary | ICD-10-CM | POA: Diagnosis not present

## 2023-06-13 DIAGNOSIS — G5621 Lesion of ulnar nerve, right upper limb: Secondary | ICD-10-CM | POA: Diagnosis not present

## 2023-06-13 DIAGNOSIS — M25521 Pain in right elbow: Secondary | ICD-10-CM | POA: Diagnosis not present

## 2023-06-20 DIAGNOSIS — M810 Age-related osteoporosis without current pathological fracture: Secondary | ICD-10-CM | POA: Diagnosis not present

## 2023-07-25 DIAGNOSIS — K5010 Crohn's disease of large intestine without complications: Secondary | ICD-10-CM | POA: Diagnosis not present

## 2023-07-25 DIAGNOSIS — E7800 Pure hypercholesterolemia, unspecified: Secondary | ICD-10-CM | POA: Diagnosis not present

## 2023-07-25 DIAGNOSIS — F321 Major depressive disorder, single episode, moderate: Secondary | ICD-10-CM | POA: Diagnosis not present

## 2023-07-25 DIAGNOSIS — Z122 Encounter for screening for malignant neoplasm of respiratory organs: Secondary | ICD-10-CM | POA: Diagnosis not present

## 2023-07-25 DIAGNOSIS — F17210 Nicotine dependence, cigarettes, uncomplicated: Secondary | ICD-10-CM | POA: Diagnosis not present

## 2023-07-25 DIAGNOSIS — M810 Age-related osteoporosis without current pathological fracture: Secondary | ICD-10-CM | POA: Diagnosis not present

## 2023-07-25 DIAGNOSIS — G4700 Insomnia, unspecified: Secondary | ICD-10-CM | POA: Diagnosis not present

## 2023-07-25 DIAGNOSIS — G8929 Other chronic pain: Secondary | ICD-10-CM | POA: Diagnosis not present

## 2023-07-25 DIAGNOSIS — F419 Anxiety disorder, unspecified: Secondary | ICD-10-CM | POA: Diagnosis not present

## 2023-07-31 ENCOUNTER — Encounter: Payer: Self-pay | Admitting: Emergency Medicine

## 2023-08-11 DIAGNOSIS — K5080 Crohn's disease of both small and large intestine without complications: Secondary | ICD-10-CM | POA: Diagnosis not present

## 2023-08-22 DIAGNOSIS — K648 Other hemorrhoids: Secondary | ICD-10-CM | POA: Diagnosis not present

## 2023-08-22 DIAGNOSIS — Z91198 Patient's noncompliance with other medical treatment and regimen for other reason: Secondary | ICD-10-CM | POA: Diagnosis not present

## 2023-08-22 DIAGNOSIS — K5080 Crohn's disease of both small and large intestine without complications: Secondary | ICD-10-CM | POA: Diagnosis not present

## 2023-09-06 DIAGNOSIS — M7701 Medial epicondylitis, right elbow: Secondary | ICD-10-CM | POA: Diagnosis not present

## 2023-09-06 DIAGNOSIS — G5621 Lesion of ulnar nerve, right upper limb: Secondary | ICD-10-CM | POA: Diagnosis not present

## 2023-09-13 DIAGNOSIS — M79645 Pain in left finger(s): Secondary | ICD-10-CM | POA: Diagnosis not present

## 2023-10-05 DIAGNOSIS — R051 Acute cough: Secondary | ICD-10-CM | POA: Diagnosis not present

## 2023-10-05 DIAGNOSIS — H6501 Acute serous otitis media, right ear: Secondary | ICD-10-CM | POA: Diagnosis not present

## 2023-10-05 DIAGNOSIS — H66002 Acute suppurative otitis media without spontaneous rupture of ear drum, left ear: Secondary | ICD-10-CM | POA: Diagnosis not present

## 2023-10-05 DIAGNOSIS — R0981 Nasal congestion: Secondary | ICD-10-CM | POA: Diagnosis not present

## 2023-10-05 DIAGNOSIS — R509 Fever, unspecified: Secondary | ICD-10-CM | POA: Diagnosis not present

## 2023-11-01 DIAGNOSIS — M1812 Unilateral primary osteoarthritis of first carpometacarpal joint, left hand: Secondary | ICD-10-CM | POA: Diagnosis not present

## 2023-11-07 DIAGNOSIS — M810 Age-related osteoporosis without current pathological fracture: Secondary | ICD-10-CM | POA: Diagnosis not present

## 2023-11-08 ENCOUNTER — Telehealth: Payer: Self-pay | Admitting: Acute Care

## 2023-11-08 DIAGNOSIS — Z122 Encounter for screening for malignant neoplasm of respiratory organs: Secondary | ICD-10-CM

## 2023-11-08 DIAGNOSIS — Z87891 Personal history of nicotine dependence: Secondary | ICD-10-CM

## 2023-11-08 DIAGNOSIS — F17210 Nicotine dependence, cigarettes, uncomplicated: Secondary | ICD-10-CM

## 2023-11-08 NOTE — Telephone Encounter (Signed)
 Lung Cancer Screening Narrative/Criteria Questionnaire (Cigarette Smokers Only- No Cigars/Pipes/vapes)   Kendra [name]   SDMV:11/15/2023 10:15 [name]      05-22-1960   LDCT: 11/21/2023 9:40 GI    63 y.o.   Phone: [phone]  Lung Screening Narrative (confirm age 42-77 yrs Medicare / 50-80 yrs Private pay insurance)   Insurance information:[employer] m[employer] and mcd   Referring Provider:Dr. Arloa   This screening involves an initial phone call with a team member from our program. It is called a shared decision making visit. The initial meeting is required by  insurance and Medicare to make sure you understand the program. This appointment takes about 15-20 minutes to complete. You will complete the screening scan at your scheduled date/time.  This scan takes about 5-10 minutes to complete. You can eat and drink normally before and after the scan.  Criteria questions for Lung Cancer Screening:   Are you a current or former smoker? Current Age began smoking: 63yo   If you are a former smoker, what year did you quit smoking? N/A(within 15 yrs)   To calculate your smoking history, I need an accurate estimate of how many packs of cigarettes you smoked per day and for how many years. (Not just the number of PPD you are now smoking)   Years smoking 48 x Packs per day 3/4 = Pack years 36   (at least 20 pack yrs)   (Make sure they understand that we need to know how much they have smoked in the past, not just the number of PPD they are smoking now)  Do you have a personal history of cancer?  No    Do you have a family history of cancer? No  Are you coughing up blood?  No  Have you had unexplained weight loss of 15 lbs or more in the last 6 months? No  It looks like you meet all criteria.  When would be a good time for us  to schedule you for this screening?   Additional information: N/A

## 2023-11-15 ENCOUNTER — Ambulatory Visit (INDEPENDENT_AMBULATORY_CARE_PROVIDER_SITE_OTHER): Admitting: Adult Health

## 2023-11-15 ENCOUNTER — Encounter: Payer: Self-pay | Admitting: Adult Health

## 2023-11-15 DIAGNOSIS — F17210 Nicotine dependence, cigarettes, uncomplicated: Secondary | ICD-10-CM

## 2023-11-15 NOTE — Patient Instructions (Signed)

## 2023-11-15 NOTE — Progress Notes (Signed)
  Virtual Visit via Telephone Note  I connected with Kendra [name] , 11/15/23 10:22 AM by a telemedicine application and verified that I am speaking with the correct person using two identifiers.  Location: Patient: home Provider: home   I discussed the limitations of evaluation and management by telemedicine and the availability of in person appointments. The patient expressed understanding and agreed to proceed.   Shared Decision Making Visit Lung Cancer Screening Program [phone])   Eligibility: 63 y.o. Pack Years Smoking History Calculation =36 pack years (# packs/per year x # years smoked) Recent History of coughing up blood  no Unexplained weight loss? no ( >Than 15 pounds within the last 6 months ) Prior History Lung / other cancer no (Diagnosis within the last 5 years already requiring surveillance chest CT Scans). Smoking Status Current Smoker  Visit Components: Discussion included one or more decision making aids. YES Discussion included risk/benefits of screening. YES Discussion included potential follow up diagnostic testing for abnormal scans. YES Discussion included meaning and risk of over diagnosis. YES Discussion included meaning and risk of False Positives. YES Discussion included meaning of total radiation exposure. YES  Counseling Included: Importance of adherence to annual lung cancer LDCT screening. YES Impact of comorbidities on ability to participate in the program. YES Ability and willingness to under diagnostic treatment. YES  Smoking Cessation Counseling: Current Smokers:  Discussed importance of smoking cessation. yes Information about tobacco cessation classes and interventions provided to patient. yes Patient provided with ticket for LDCT Scan. yes Symptomatic Patient. NO Diagnosis Code: Tobacco Use Z72.0 Asymptomatic Patient yes  Counseling - 4 minutes of smoking cessation counseling (CT Chest Lung Cancer Screening Low Dose W/O CM)  [id number]  Z12.2-Screening of respiratory organs Z87.891-Personal history of nicotine  dependence   Kendra [name] 11/15/23

## 2023-11-16 ENCOUNTER — Encounter: Payer: Self-pay | Admitting: Acute Care

## 2023-11-21 ENCOUNTER — Ambulatory Visit
Admission: RE | Admit: 2023-11-21 | Discharge: 2023-11-21 | Disposition: A | Discharge status: Home / Self Care | Source: Ambulatory Visit | Attending: Acute Care | Admitting: Acute Care

## 2023-11-21 DIAGNOSIS — F17210 Nicotine dependence, cigarettes, uncomplicated: Secondary | ICD-10-CM | POA: Diagnosis not present

## 2023-11-21 DIAGNOSIS — Z122 Encounter for screening for malignant neoplasm of respiratory organs: Secondary | ICD-10-CM | POA: Diagnosis not present

## 2023-11-21 DIAGNOSIS — Z87891 Personal history of nicotine dependence: Secondary | ICD-10-CM

## 2023-11-23 ENCOUNTER — Other Ambulatory Visit: Payer: Self-pay | Admitting: Family Medicine

## 2023-11-23 DIAGNOSIS — Z1231 Encounter for screening mammogram for malignant neoplasm of breast: Secondary | ICD-10-CM

## 2023-11-24 ENCOUNTER — Other Ambulatory Visit: Payer: Self-pay | Admitting: Acute Care

## 2023-11-24 DIAGNOSIS — Z122 Encounter for screening for malignant neoplasm of respiratory organs: Secondary | ICD-10-CM

## 2023-11-24 DIAGNOSIS — F17210 Nicotine dependence, cigarettes, uncomplicated: Secondary | ICD-10-CM

## 2023-11-24 DIAGNOSIS — Z87891 Personal history of nicotine dependence: Secondary | ICD-10-CM

## 2023-11-28 ENCOUNTER — Ambulatory Visit
Admission: RE | Admit: 2023-11-28 | Discharge: 2023-11-28 | Disposition: A | Discharge status: Home / Self Care | Source: Ambulatory Visit | Attending: Family Medicine | Admitting: Family Medicine

## 2023-11-28 DIAGNOSIS — Z1231 Encounter for screening mammogram for malignant neoplasm of breast: Secondary | ICD-10-CM

## 2023-12-25 DIAGNOSIS — M810 Age-related osteoporosis without current pathological fracture: Secondary | ICD-10-CM | POA: Diagnosis not present

## 2024-01-03 DIAGNOSIS — M1812 Unilateral primary osteoarthritis of first carpometacarpal joint, left hand: Secondary | ICD-10-CM | POA: Diagnosis not present

## 2024-01-10 DIAGNOSIS — G4700 Insomnia, unspecified: Secondary | ICD-10-CM | POA: Diagnosis not present

## 2024-01-10 DIAGNOSIS — F419 Anxiety disorder, unspecified: Secondary | ICD-10-CM | POA: Diagnosis not present

## 2024-01-10 DIAGNOSIS — M810 Age-related osteoporosis without current pathological fracture: Secondary | ICD-10-CM | POA: Diagnosis not present

## 2024-01-10 DIAGNOSIS — F17210 Nicotine dependence, cigarettes, uncomplicated: Secondary | ICD-10-CM | POA: Diagnosis not present

## 2024-01-10 DIAGNOSIS — R0981 Nasal congestion: Secondary | ICD-10-CM | POA: Diagnosis not present

## 2024-01-10 DIAGNOSIS — Z0000 Encounter for general adult medical examination without abnormal findings: Secondary | ICD-10-CM | POA: Diagnosis not present

## 2024-01-10 DIAGNOSIS — F321 Major depressive disorder, single episode, moderate: Secondary | ICD-10-CM | POA: Diagnosis not present

## 2024-01-10 DIAGNOSIS — G8929 Other chronic pain: Secondary | ICD-10-CM | POA: Diagnosis not present

## 2024-01-10 DIAGNOSIS — E7800 Pure hypercholesterolemia, unspecified: Secondary | ICD-10-CM | POA: Diagnosis not present

## 2024-01-10 DIAGNOSIS — K5010 Crohn's disease of large intestine without complications: Secondary | ICD-10-CM | POA: Diagnosis not present

## 2024-01-17 ENCOUNTER — Other Ambulatory Visit: Payer: Self-pay | Admitting: Obstetrics and Gynecology
